# Patient Record
Sex: Female | Born: 1975 | Hispanic: No | Marital: Married | Smoking: Never smoker
Health system: Southern US, Community
[De-identification: ages and names within clinical notes are randomized; demographics above are authoritative.]

## PROBLEM LIST (undated history)

## (undated) DIAGNOSIS — O24419 Gestational diabetes mellitus in pregnancy, unspecified control: Secondary | ICD-10-CM

## (undated) DIAGNOSIS — E119 Type 2 diabetes mellitus without complications: Secondary | ICD-10-CM

## (undated) HISTORY — PX: NO PAST SURGERIES: SHX2092

---
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
## 2018-12-22 NOTE — L&D Delivery Note (Signed)
LABOR COURSE Patient was admitted 09/01/19 for IOL due to Polyhydramnios. Patient also had non-reactive NST and BPP 4/8 in clinic 09/01/19. She was started on Pitocin and augmented with AROM.   She developed Gestational Hypertension during the intrapartum period. Her blood pressures were elevated but below severe range and she did not develop severe symptoms. PEC labs WNL.  Delivery Note Called to room and patient was complete and pushing. Head delivered OA. No nuchal cord present. Compound hand presentation noted. Shoulder and body delivered in usual fashion. At 0516 a viable female was delivered via Vaginal, Spontaneous (Presentation: OA, no restitution).  Infant with spontaneous cry, placed on mother's abdomen, dried and stimulated. Cord clamped x 2 after one-minute delay, and cut by FOB. Cord blood drawn. Placenta delivered spontaneously with gentle cord traction. Appears intact. Fundus firm with massage and Pitocin. 800 mcg Cytotec placed rectally for brisk bleeding. Labia, perineum, vagina, and cervix inspected.   APGAR:8 ,9 ; weight : 4020g .   Cord: 3VC with the following complications:none.   Cord pH: Not collected  Anesthesia:  No epidural, local Lidocaine used for perineal repair Episiotomy: None Lacerations: 2nd degree perineal Suture Repair: 2.0 vicryl Est. Blood Loss (mL): 300  Mom to postpartum.  Baby to Couplet care / Skin to Skin.  Mallie Snooks, CNM 09/02/19 7:27 AM

## 2019-05-26 ENCOUNTER — Ambulatory Visit (INDEPENDENT_AMBULATORY_CARE_PROVIDER_SITE_OTHER): Payer: Self-pay | Admitting: *Deleted

## 2019-05-26 ENCOUNTER — Other Ambulatory Visit: Payer: Self-pay

## 2019-05-26 DIAGNOSIS — Z789 Other specified health status: Secondary | ICD-10-CM | POA: Insufficient documentation

## 2019-05-26 DIAGNOSIS — O0990 Supervision of high risk pregnancy, unspecified, unspecified trimester: Secondary | ICD-10-CM | POA: Insufficient documentation

## 2019-05-26 DIAGNOSIS — O09529 Supervision of elderly multigravida, unspecified trimester: Secondary | ICD-10-CM | POA: Insufficient documentation

## 2019-05-26 DIAGNOSIS — O0992 Supervision of high risk pregnancy, unspecified, second trimester: Secondary | ICD-10-CM

## 2019-05-26 NOTE — Progress Notes (Signed)
I connected with  Brooke Reid on 05/26/19 at 10:15 AM EDT by telephone and verified that I am speaking with the correct person using two identifiers.   I discussed the limitations, risks, security and privacy concerns of performing an evaluation and management service by telephone and virtually and the availability of in person appointments. I also discussed with the patient that there may be a patient responsible charge related to this service. The patient expressed understanding and agreed to proceed. She has not started prenatal care- was sent to [place] by [facility]. She is certain of her LMP and has regular periods.  Assisted with attempting to downloading and starting webex app. - after multiple attempts informed her we will help her in office at her new ob appt.  She states she has a bp cuff at home. Eplained we will have her take her blood pressure weekly and log it - then give to [place] at each visit . Also explained if 140/90 or higher call our office.  Also gave her appointment for new ob appointment and to wear mask to each appointment in our office - and if having any covid 19 symptoms do not come to office- call us. Explained she will be given her  [place] appointment at her next visit that I will schedule today. She voices understanding.   Linda,RN 05/26/2019  10:27 AM

## 2019-05-30 ENCOUNTER — Other Ambulatory Visit: Payer: Self-pay

## 2019-05-30 ENCOUNTER — Ambulatory Visit (INDEPENDENT_AMBULATORY_CARE_PROVIDER_SITE_OTHER): Payer: Self-pay | Admitting: Obstetrics and Gynecology

## 2019-05-30 ENCOUNTER — Encounter: Payer: Self-pay | Admitting: Obstetrics and Gynecology

## 2019-05-30 VITALS — BP 129/88 | HR 88 | Temp 98.2°F | Wt 218.6 lb

## 2019-05-30 DIAGNOSIS — O09522 Supervision of elderly multigravida, second trimester: Secondary | ICD-10-CM

## 2019-05-30 DIAGNOSIS — O0992 Supervision of high risk pregnancy, unspecified, second trimester: Secondary | ICD-10-CM

## 2019-05-30 DIAGNOSIS — Z3A25 25 weeks gestation of pregnancy: Secondary | ICD-10-CM

## 2019-05-30 DIAGNOSIS — Z124 Encounter for screening for malignant neoplasm of cervix: Secondary | ICD-10-CM

## 2019-05-30 DIAGNOSIS — Z789 Other specified health status: Secondary | ICD-10-CM

## 2019-05-30 DIAGNOSIS — Z113 Encounter for screening for infections with a predominantly sexual mode of transmission: Secondary | ICD-10-CM

## 2019-05-30 DIAGNOSIS — Z1151 Encounter for screening for human papillomavirus (HPV): Secondary | ICD-10-CM

## 2019-05-30 DIAGNOSIS — O09529 Supervision of elderly multigravida, unspecified trimester: Secondary | ICD-10-CM

## 2019-05-30 DIAGNOSIS — O0990 Supervision of high risk pregnancy, unspecified, unspecified trimester: Secondary | ICD-10-CM

## 2019-05-30 NOTE — Addendum Note (Signed)
Addended by: Bethanne Ginger on: 05/30/2019 09:41 AM   Modules accepted: Orders

## 2019-05-30 NOTE — Patient Instructions (Addendum)
AREA PEDIATRIC/FAMILY PRACTICE PHYSICIANS  [place] ([address]) . [facility] o Chambliss, MD; Eniola, MD; Hale, MD; Hensel, MD; McDiarmid, MD; McIntyer, MD; Neesa Knapik, MD; Walden, MD o [address] [place], [place] [address] o [phone] o Mon-Fri 8:30-12:30, 1:30-5:00 o Providers come to see babies at [facility] o Accepting Medicaid . [facility] o Limited providers who accept newborns: Koirala, MD; Morrow, MD; Wolters, MD o [address], [place], [place] [address] o [phone] o Mon-Fri 8:00-5:30 o Babies seen by providers at [facility] o Does NOT accept Medicaid o Please call early in hospitalization for appointment (limited availability)  . [facility] o Mulberry, MD o [address] [place], [place] [address] o [phone] o Mon, Tue, Thur, Fri 8:30-5:00, Wed 10:00-7:00 (closed 1-2pm) o Babies seen by [facility] providers o Accepting Medicaid . [facility] Pediatric[facility] o Rubin, MD o [address], [place], [place] [address] o [phone] o Mon-Fri 8:30-5:00, Sat 8:30-12:00 o Provider comes to see babies at [facility] o Accepting Medicaid o Must have been referred from current patients or contacted office prior to delivery . [facility] ([facility] o Brown, MD; Chandler, MD; Ettefagh, MD; Grant, MD; Lester, MD; McCormick, MD; McQueen, MD; Prose, MD; Simha, MD; Stanley, MD; Stryffeler, NP; Tebben, NP o [address], [place], [place] [address] o [phone] o Mon, Tue, Thur, Fri 8:30-5:30, Wed 9:30-5:30, Sat 8:30-12:30 o Babies seen by [facility] providers o Accepting Medicaid o Only accepting infants of first-time parents or siblings of current patients o Hospital discharge coordinator will make follow-up appointment . Jack Amos o [address],  [place], [place]  [address] o [phone]   Fax - [phone] . [facility] o [address], [place], [place]  [address] o Phone - [phone]   Fax - [phone] . Shilpa Gosrani o [address], [place], [place]  [address] o [phone]  [place] ([address]) . [facility] o Bates, MD; [place], MD; Cooper, Cox, MD; MD; Davis, MD; Dovico, MD; Ettefaugh, MD; Little, MD; Lowe, MD; Keiffer, MD; Melvin, MD; Sumner, MD; Williams, MD o [address], [place], [place] [address] o [phone] o Mon-Fri 8:30-5:00 (extended evenings Mon-Thur as needed), Sat-Sun 10:00-1:00 o Providers come to see babies at [facility] o Accepting Medicaid for families of first-time babies and families with all children in the household age 3 and under. Must register with office prior to making appointment (M-F only). . [facility] o Henson, NP; Knapp, MD; Lalonde, MD; Tysinger, PA o [address] [place], [place] [address] o [phone] o Mon-Fri 8:00-5:00 o Babies seen by providers at [facility] o Does NOT accept Medicaid/Commercial Insurance Only . [facility] ([facility]  o Artis, MD; Barnes, MD; Bratton, MD; Coccaro, MD; [place] Gardner, MD; Kramer, MD; Marshall, MD; Netherton, MD; Poleto, MD; Skinner, MD o [address] [place], [place] [address] o [phone] o Mon-Fri 8:30-5:30, Sat (Oct.-Mar.) 9:00-1:00 o Babies seen by providers at [facility] o Accepting Medicaid  [place] ([address]) . [facility] o Reid, MD; Warner, MD o [address], [place],  [address] o [phone] o Mon-Fri 8:30-5:00, Sat 8:30-12:00 o Providers come to see babies at [facility] o Does NOT accept Medicaid . [facility] o Becker, PA; Hagler, MD; Scifres, PA; Sun, MD; Swayne, MD o [address],  [place], [place] [address] o [phone] o Mon-Fri 8:00-5:00 o Babies seen by providers at [facility] o Does NOT accept Medicaid o Only accepting babies of parents who are patients o Please call early in hospitalization for appointment (limited availability) . [facility] o Clark, MD; Frye, MD; Kelleher, MD; Mack, NP; Miller, MD; O'Keller, MD; Patterson, NP; Pudlo, MD; Puzio, MD; Thomas, MD; Tucker, MD; Twiselton, MD o [address], [place], [place] [address] o [phone] o Mon-Fri 8:00-5:00, Sat 9:00-12:00 o Providers come to see babies at [facility] o Does NOT accept Medicaid  [place] ([address]) . [facility] o Limited providers accepting new patients: Brake, NP; Wharton, PA o [address], [place], [place] [address] o [phone] o Mon-Fri 8:00-5:00 o Babies seen by providers at [facility] o Does NOT accept Medicaid o Only accepting babies of parents who are patients o Please call early in hospitalization for appointment (limited availability) . [facility] o Gay, MD; Quinlan, MD o [address] [place], [place] [address] o [phone] (press 1 to schedule appointment) o Mon-Fri 8:00-5:00 o Providers come to see babies at [facility] o Does NOT accept Medicaid . [facility] o Mazer, MD o [address] [place], [place] [address] o [phone] o Mon-Fri 8:30-5:00 (lunch 12:30-1:00), extended hours by appointment only Wed 5:00-6:30 o Babies seen by [facility] providers o Accepting Medicaid . [facility] o Banks, MD; Jordan, MD; Koberlein, MD o [address], [place], [place] [address] o [phone] o Mon-Fri 8:00-5:00 o Babies seen by [facility] providers o Does NOT accept Medicaid . [facility] o Parker, MD; Hunter, MD; Wallace, DO o [address] [place], [place]  [address] o [phone] o Mon-Fri 8:00-5:00 o Babies seen by [facility] providers o Does NOT accept Medicaid . [facility] o Brandon, PA; Brecken, PA; Christy, NP; Dees, MD; DeClaire, MD; DeWeese, MD; Hansen, NP; Mills, NP; Parrish, NP; Smoot, NP; Summer, MD; Vapne, MD o [address] [place], [place] [address] o [phone] o Mon-Fri 8:30-5:00, Sat 10:00-1:00 o Providers come to see babies at [facility] o Does NOT accept Medicaid o Free prenatal information session Tuesdays at 4:45pm . [facility] o Bouska, MD; Gordon, PA; Jeffery, PA; Weber, PA o [address] [place] [address] o [phone] o Mon-Fri 7:30-5:30 o Babies seen by [facility] providers . [facility] o [address], [place], [place], [place]  [address] o [phone]   Fax - [phone]  [place] ([phone] & [phone]) . [facility] o [place]ese, [place] o [address] [place], [place] [address] o [phone] o Mon-Thur 8:00-6:00 o Providers come to see babies at [facility] o Accepting Medicaid . [facility] o Anderson, NP; Badger, MD; Beal, PA; Spencer, PA o [address] [place],  [address] o [phone] o Mon-Thur 7:30-7:30, Fri 7:30-4:30 o Babies seen by [facility] providers o Accepting Medicaid . [facility] o Agbuya, MD; Klett, NP; Romgoolam, MD o [address], ,  [address] o [phone] o Mon-Fri 8:30-5:00, Sat 8:30-12:00 o Providers come to see babies at [facility] o Accepting Medicaid o Must have "Meet & Greet" appointment at office prior to delivery . [facility]  ([facility] ) o McCord,   MD; Juleen China, MD; Clydene Laming, [place] [address], [place], [place] [address] o [phone] o Mon-Wed 8:00-6:00, Thur-Fri 8:00-5:00, Sat 9:00-12:00 o Providers come to  see babies at [facility] o Does NOT accept Medicaid o Only accepting siblings of current patients . [facility] [place], [place], [place], [place]  [address] o [phone]   Fax [phone] . [facility] [address], [place], [place]  [address] o [phone]   Fax - Morton Gorman [phone] & [phone]) . Therapist, music at [place], DO; [place], [address] [place], [place] [address] o [phone] o Mon-Fri 7:00-5:00 o Babies seen by [facility] providers o Does NOT accept Medicaid . [place], MD; [place], [place]; [place], [place], [place], [place] [address] o [phone] o Mon-Fri 8:00-5:00 o Babies seen by [facility] providers o Accepting Medicaid . Lamont, MD; [place], [place]; [place], NP; [place], [place], [place], [place] [address] o [phone] o Mon-Fri 8:00-5:00 o Babies seen by providers at [facility] [place] [phone]) . [facility] [place], [place] o [address] [place], [place] [address] o [phone] o Mon-Fri 8:00-5:00 o Babies seen by [facility] providers o Does NOT accept Medicaid o Limited availability, please call early in hospitalization to schedule follow-up . [facility] Leilani Merl, PA; Maisie Fus, MD; [place], MD; [place], [place]; Jeannine Kitten, MD; [place], [place] [facility] [address], [place], [place] [address] o [phone] o Mon-Fri 8:30-5:00, Sat 9:00-12:00 o Babies seen by providers at [facility] o Accepting Medicaid o Please register online then schedule online or call office o [email or web address] . [place] [facility]  [place]) Kristian C[place]y, NP; Dwyane Dee, MD; Leonidas Romberg, PA o [address] [place], [place], [place] [address] o [phone] o Mon-Fri 8:00-5:00 o Babies seen by providers at [facility] o Accepting Medicaid . [place] ([facility]) Dairl Ponder, MD; Rayvon Char, NP; Melina Modena, MD o [address] [place], [place], [place] [address] o [phone] o Mon-Fri 8:00-5:30, Sat&Sun by appointment (phones open at 8:30) o Babies seen by [facility] providers o Accepting Medicaid o Must be a first-time baby or sibling of current patient . [place], [address], [place], [place]  [address] o [phone]   Fax - [phone]  [place] [phone] & [phone]) . [place], [place]; [place], [place]; Benjamine Mola, MD; [place], [place]; Harrell Lark, MD o [address] [place], [place] [address] o [phone] o Mon-Thur 8:00-7:00, Fri 8:00-5:00, Sat 8:00-12:00, Sun 9:00-12:00 o Babies seen by [facility] providers o Accepting Medicaid . [facility], MD; Ruthann Cancer, MD; [facility], MD o [address], [place], [place] [address] o [phone] o Mon-Thur 8:00-5:00 o Babies seen by providers at [facility] o Accepting Medicaid . [facility] [place], MD; Coe-Goins, MD; Amedeo Plenty, MD; Bobby Rumpf, MD; List, MD; Lavonia Drafts, MD; Ruthann Cancer, MD; Selinda Eon, MD; Audie Box, MD; Jim Like, MD; Christie Nottingham, MD; Hubbard Hartshorn, MD; Modena Nunnery, MD o [address] [place], [place]  [address] o [phone] o Mon-Fri 8:00-5:30, Sat (Oct.-Mar.) 9:00-1:00 o Babies seen by providers at [facility] o Accepting Medicaid o Must fill out new patient packet, available online at [email or web address] . [facility] ([facility] o Friddle, NP; Harris, NP; Kelly, NP; Logan, MD;  Melvin, PA; Poth, MD; Ramadoss, MD; Stanton, NP o [address], [place], [place] [address] o [phone] o Mon-Thur 8:00-5:30, Fri 8:00-5:00 o Babies seen by providers at [facility] o Accepting Medicaid  [place] ([address]) . [facility] o Dixon, PA; Dawson, MD; Pickard, MD; Tapia, PA o [address], [place], [place] [address] o [phone] o Mon-Fri 8:00-5:00 o Babies seen by providers at [facility] o Accepting Medicaid   [place] ([address]) . [facility] [place] o Masneri, DO; Meyers, MD; Nelson, PA o [address], [place], [place] [address] o [phone] o Mon-Fri 8:00-5:00 o Babies seen by providers at [facility] o Does NOT accept Medicaid o Limited appointment availability, please call early in hospitalization  . [facility] [place] o Kunedd, DO; McGowen, MD o [address], [place], [place] [address] o [phone] o Mon-Fri 8:00-5:00 o Babies seen by [facility] providers o Does NOT accept Medicaid . [facility] [place] o Cameron, MD; MacDonald, MD; Michaels, PA; Nayak, MD o [address] [place] [address], [place], [place] [address] o [phone] o Mon-Fri 8:00-5:00 o After hours clinic ([address] [place], [place] [address]) [phone] Mon-Fri 5:00-8:00, Sat 12:00-6:00, Sun 10:00-4:00 o Babies seen by [facility] providers o Accepting Medicaid . [facility] [place] o [address], [place], [place]  [address] o [phone]   Fax - [phone]  [place] ([address]) . [facility] [place] [facility] o Andy, MD o [address], [place], [place] [address] o [phone] o Mon-Fri 8:00-5:00 o Babies seen by [facility] providers o Does NOT accept Medicaid . [facility] [place] ([facility] [place]) o Eksir, MD o [address], [place], [place]  [address] o [phone] o Mon-Thur 8:00-7:00, Fri 8:00-5:00, Sat 8:00-12:00 o Babies seen by providers at [facility] o Accepting Medicaid - but does not have vaccinations in office (must be received elsewhere) o Limited availability, please call early in hospitalization  [place] ([address]) . [facility]  o Charlene Flemming, MD o [address], [place] [address] o [phone]  Fax [phone] Places to have your son circumcised:                                                                      [facility]     [phone]   $480 while you are in hospital         [facility]              [phone]   $269 by 4 wks                      [facility]                     [phone]   $  269 by 7 days [facility]                    [phone]   $269 by 4 wks [facility]             [phone]   $175 by 2 wks    These prices sometimes change but are roughly what you can expect to pay. Please call and confirm pricing.   Circumcision is considered an elective/non-medically necessary procedure. There are many reasons parents decide to have their sons circumsized. During the first year of life circumcised males have a reduced risk of urinary tract infections but after this year the rates between circumcised males and uncircumcised males are the same.  It is safe to have your son circumcised outside of the hospital and the places above perform them regularly.   Deciding about Circumcision in Baby Boys  (Up-to-date The Basics)  What is circumcision?   Circumcision is a surgery that removes the skin that covers the tip of the penis, called the "foreskin" Circumcision is usually done when a boy is between 54 and 102 days old. In the [place], circumcision is common. In some other countries, fewer boys are circumcised. Circumcision is a common tradition in some religions.  Should I have my baby boy circumcised?   There is no easy answer. Circumcision has some benefits. But it also has risks.  After talking with your doctor, you will have to decide for yourself what is right for your family.  What are the benefits of circumcision?   Circumcised boys seem to have slightly lower rates of: ?Urinary tract infections ?Swelling of the opening at the tip of the penis Circumcised men seem to have slightly lower rates of: ?Urinary tract infections ?Swelling of the opening at the tip of the penis ?Penis cancer ?HIV and other infections that you catch during sex ?Cervical cancer in the women they have sex with Even so, in the [place], the risks of these problems are small - even in boys and men who have not been circumcised. Plus, boys and men who are not circumcised can reduce these extra risks by: ?Cleaning their penis well ?Using condoms during sex  What are the risks of circumcision?  Risks include: ?Bleeding or infection from the surgery ?Damage to or amputation of the penis ?A chance that the doctor will cut off too much or not enough of the foreskin ?A chance that sex won't feel as good later in life Only about 1 out of every 200 circumcisions leads to problems. There is also a chance that your health insurance won't pay for circumcision.  How is circumcision done in baby boys?  First, the baby gets medicine for pain relief. This might be a cream on the skin or a shot into the base of the penis. Next, the doctor cleans the baby's penis well. Then he or she uses special tools to cut off the foreskin. Finally, the doctor wraps a bandage (called gauze) around the baby's penis. If you have your baby circumcised, his doctor or nurse will give you instructions on how to care for him after the surgery. It is important that you follow those instructions carefully.  Ba thng th? hai c?a [place] k? Second Trimester of Pregnancy Ba thng th? hai l t? tu?n 14 ??n tu?n 27 (thng th? 4 ??n th? thng th? 6). Ba thng th? hai th??ng l th?i gian m qu v? c?m th?y tho?i mi nh?t. C? th?  c?a qu v? c?ng ? ?i?u ch?nh ?? ph h?p v?i vi?c mang thai v qu v? b?t ??u c?m th?y t?t h?n v? m?t th? ch?t. Thng th??ng, tnh tr?ng nghn ? t h?n ho?c h?t hon ton, qu v? c th? c nhi?u n?ng l??ng h?n v qu v? c th? ?n ngon mi?ng h?n. Ba thng th? hai c?ng l th?i gian m bo [place] ?ang pht tri?n nhanh chng. Vo cu?i thng th? su, bo thai di kho?ng 9 in s? v n?ng kho?ng 1 pao. Qu v? c kh? n?ng s? b?t ??u c?m th?y em b c? ??ng (thai ??p l?n ??u) trong kho?n tu?n 16 ??n 20 c?a [place] k?. Cc thay ??i c? th? trong ba thng th? hai c?a [place] k? C? th? qu v? b?t ??u tr?i qua nhi?u thay ??i trong ba thng th? hai c?a [place] k?. Nh?ng thay ??i khc nhau ? cc ph? n? s? khc nhau. Cn n?ng c?a qu v? s? ti?p t?c t?ng. Qu v? s? nh?n th?y b?ng ph?ng to ra. Qu v? c th? b?t ??u c cc v?t r?n trn hng, b?ng v v. Qu v? c th? b? ?au ??u, c th? gi?m ?au b?ng thu?c. Cc lo?i thu?c ph?i ???c chuyn gia ch?m [place] s?c kh?e c?a qu v? ch?p thu?n. Qu v? c th? ?i ti?u th??ng xuyn h?n v bo thai p vo bng quang c?a qu v?. Qu v? c th? b? ho?c ti?p t?c b? ? nng do k?t qu? c?a vi?c mang [place]. Qu v? c th? b? to bn v m?t s? hc-mn nh?t ??nh ?ang lm cc c? ??y ch?t th?i qua ???ng ru?t ho?t ??ng ch?m l?i. Qu v? c th? b? tr? ho?c s?ng, phnh t?nh m?ch (gin t?nh m?ch). Quy? vi? c th? bi? ?au l?ng. V?n ?? ny l do: T?ng cn. Cc hc-mn [place] k? lm th? gin cc kh?p ? khung ch?u c?a qu v?. Thay ??i v? cn n?ng v cc c? h? tr? th?ng b?ng c?a qu v?. V c?a qu v? s? ti?p t?c pht tri?n v ti?p t?c tr? nn nh?y c?m ?au. N??u (l?i) c?a qu v? c th? ch?y mu v c th? nh?y c?m v?i vi?c ?nh r?ng v dng ch? Darcie khoa. Cc ??m ho?c v?t t?i mu (rm m, m?t n? [place] k?) c th? pht tri?n trn m?t qu v?. V?t ny c th? s? m? sau khi em b ???c sinh ra. M?t ???ng s?m mu ch?y t? r?n ??n vng lng mu (???ng linea nigra) c th? xu?t hi?n. V?t ny c th? s? m? sau khi em b ???c sinh ra. Qu  v? c nh?ng thay ??i v? tc. Nh?ng thay ??i ny c th? bao g?m tc dy ln, tc m?c nhanh h?n v thay ??i v? k?t c?u tc. M?t s? ph? n? c?ng b? r?ng tc trong khi ho?c sau khi mang thai, ho?c c?m th?y tc kh ho?c m?ng. Tc c?a qu v? s? c nhi?u kh? n?ng s? tr? l?i bnh th??ng sau khi em b ???c sinh ra. Nh?ng g d? ki?n s? x?y ra ? cc l?n khm tr??c khi sinh Trong m?t l?n khm tr??c khi sinh th??ng quy: Qu v? s? ???c cn ?? ??m b?o qu v? v [place] nhi ?ang pht tri?n bnh th??ng. Qu v? s? ???c ?o huy?t p. Qu v? s? ???c ?o vng b?ng ?? theo di s? pht tri?n c?a b. Nh?p tim thai s? ???c nghe. B?t k? k?t qu? ki?m tra no t?  l?n khm tr??c ? c?ng s? ???c th?o lu?n. Chuyn gia ch?m [place] s?c kh?e c th? h?i qu v?: Qu v? c?m th?y th? no. Qu v? c c?m th?y em b c? ??ng hay khng. Qu v? c b?t k? tri?u ch?ng b?t th??ng no, ch?ng h?n nh? ch?y d?ch, ch?y mu, ?au ??u r?t nhi?u ho?c co th?t ? b?ng hay khng. Qu v? c ?ang s? d?ng b?t k? s?n ph?m thu?c l no, bao g?m thu?c l d?ng ht, thu?c l d?ng nhai v thu?c l ?i?n t? hay khng. Qu v? c b?t k? cu h?i no hay khng. Nh?ng ki?m tra khc c th? ???c th?c hi?n trong ba thng th? hai c?a qu v? bao g?m: Xt nghi?m mu ?? ki?m tra: L??ng s?t th?p (thi?u mu). ???ng huy?t cao ?nh h??ng ??n ph? n? mang thai (ti?u ???ng [place] k?) t? tu?n 24 ??n tu?n 28. Khng th? RH. Xt nghi?m ny ?? ki?m tra m?t protein trn h?ng c?u (y?u t? Rh). Xt nghi?m n??c ti?u ?? ki?m tra nhi?m trng, ti?u ???ng ho?c protein trong n??c ti?u. Siu m ?? xc ??nh s? t?ng tr??ng v pht tri?n ph h?p c?a em b. Ch?c ?i ?? ki?m tra cc v?n ?? v? di truy?n c th? c. Ki?m tra bo [place] xem c b? n?t ??t s?ng ho?c h?i ch?ng Down hay khng. Xt nghi?m HIV (vi rt gy suy gi?m mi?n d?ch ? ng??i). Xt nghi?m tr??c khi sinh th??ng quy bao g?m sng l?c HIV, tr? khi qu v? ch?n khng lm xt nghi?m ny. Tun th? nh?ng h??ng d?n ny ? nh: Thu?c Tun th? cc ch? d?n c?a  chuyn gia ch?m [place] s?c kh?e v? vi?c s? d?ng thu?c. M?t s? lo?i thu?c c? th? co? th? an ton ho?c khng an ton [place] dng trong qu trnh [place] thai. U?ng vitamin tr??c khi sinh c t nh?t 600 microgram (mcg) axit folic. N?u quy? vi? bi? ta?o bo?n, hy th? dng thu?c lm m?m phn n?u chuyn gia ch?m [place] s?c kh?e c?a qu v? ch?p thu?n. ?n v u?ng  ?n ch? ?? ?n cn b?ng bao g?m tri cy t??i v rau c?, ng? c?c nguyn cm, cc ngu?n protein t?t nh? th?t, tr?ng, ??u h? v s?a t bo. Chuyn gia ch?m [place] s?c kh?e c?a quy? vi? s? gip quy? vi? xc ??nh m??c t?ng cn phu? h??p cho quy? vi?. [place] th?t s?ng v pho mt ch?a n?u chn. Nh?ng th? ny mang m?m b?nh c th? gy d? t?t b?m sinh cho em b. N?u qu v? s? d?ng l??ng canxi th?p t? th?c ph?m, hy trao ??i v?i chuyn gia ch?m [place] s?c kh?e v? vi?c qu v? c c?n dng th?c ph?m b? sung canxi hng ngy hay khng. H?n ch? cc lo?i th?c ?n giu ch?t bo v ???ng tinh luy?n, ch?ng h?n nh? ?? ?n chin rn v ?? ng?t. ?? ng?n ng?a to bn: U?ng ?? n??c ?? gi? cho n??c ti?u trong ho?c c mu vng nh?t. ?n th?c ?n giu ch?t x? nh? tri cy t??i v rau, ng? c?c nguyn h?t v cc lo?i ??u. Ho?t ??ng Ch? t?p th? d?c theo ch? d?n c?a chuyn gia ch?m [place] s?c kh?e. H?u h?t ph? n? c th? ti?p t?c ch? ?? t?p luy?n bnh th??ng c?a h? trong khi mang [place]. C? g??ng t?p th? du?c 30 phu?t m?i nga?y, t nh?t 5 nga?y m?i tu?n. Ng?ng t?p th? d?c n?u qu v? c cc c?n co t? cung.  Trnh nng v?t n?ng, hy ?i giy th?p gt v luy?n t?p t? th? ph h?p. C th? ???c ti?p t?c c quan h? tnh d?c tr? khi chuyn gia ch?m [place] s?c kh?e yu c?u khc. Gi?m ?au v gi?m c?m gic kh ch?u M?c m?t chi?c o ng?c nng ?? v hi?u qu? ?? ng?n ng?a c?m gic kh ch?u do v b? nh?y c?m ?au. T?m b?n ng?i n??c ?m ?? lm d?u ?au ho?c d?u c?m gic kh ch?u do b?nh tr? gy ra. S? d?ng kem ?i?u tr? tr? n?u chuyn gia ch?m [place] s?c kh?e ch?p thu?n. Ngh? ng?i trong khi nng cao chn c?a qu v? n?u qu v?  b? chu?t rt chn ho?c ?au vng th?t l?ng. N?u qu v? b? gin t?nh m?ch, hy ?eo t?t h? tr?. Nng cao chn c?a qu v? trong 15 pht, 3-4 l?n m?t ngy. H?n ch? mu?i trong ch? ?? ?n c?a qu v?. Ch?m [place] tr???c khi sinh Vi?t ra cu h?i c?a quy? vi?. ?em ca?c cu ho?i ??n ca?c bu?i kha?m tr???c khi sinh. Tun th? t?t c? cc cu?c h?n khm tr??c khi sinh theo ch? d?n c?a chuyn gia ch?m [place] s?c kh?e. ?i?u ny c vai tr quan tr?ng. An ton Lun ?eo dy th?t an ton c?a qu v? khi li xe. L?p m?t danh sch cc s? ?i?n tho?i c?p c?u, bao g?m s? ?i?n thoa?i gia ?nh, b?n b, b?nh vi?n v c?nh st v s? c?u h?a. H??ng d?n chung H?i chuyn gia ch?m [place] s?c kh?e c?a quy? vi? ?? ???c gi?i thi?u ??n m?t l?p h?c tr???c khi sinh t?i ??a ph??ng. B?t ??u ca?c l?p h?c khng mu?n h?n vo ??u thng th?? 6 c?a [place]?. Yu c?u gip ?? n?u quy? vi? c nhu c?u t? v?n ho?c nhu c?u dinh d??ng trong th??i gian mang thai. Chuyn gia ch?m [place] s?c kh?e c?a quy? vi? c th? co? l?i khuyn ho?c gi?i thi?u quy? vi? ??n cc chuyn gia ?? ???c gip ?? v? ca?c nhu c?u khc nhau. Khng s? d?ng b?n t?m n??c nng, phng xng h?i, ho?c nh t?m h?i. Khng thu?t r??a ho?c s? d?ng nu?t b?ng v? sinh ho?c b?ng v? sinh c mi th?m. Khng b??t cho chn trong th?i gian di. Trnh h?p v? sinh c?a mo v ??t v? sinh dnh cho mo. Nh?ng th?? na?y mang vi trng c th? gy ra d? t?t b?m sinh ? tr? v c th? m?t [place] nhi do s?y [place] ho?c thai ch?t l?u. Trnh t?t c? khi, th?o d??c, r??u v ma ty khng ???c k ??n. Cc ch?t ha h?c trong nh??ng s?n ph?m ny c th? ?nh h??ng ??n s? hnh thnh v pht tri?n c?a em b. Khng s? d?ng b?t k? s?n ph?m no ch?a nicotine ho?c thu?c l, ch?ng ha?n nh? thu?c l d?ng ht v thu?c l ?i?n t?. N?u qu v? c?n gip ?? ?? cai thu?c, hy h?i chuyn gia ch?m [place] s?c kh?e. ??n khm [place] s? n?u qu v? ch?a khm trong th?i gian qu v? mang [place]. S? d?ng m?t bn ch?i m?m ?? ?nh r?ng c?a qu v? v hy nh? nhng khi  dng ch? Keylani khoa. Hy lin l?c v?i chuyn gia ch?m [place] s?c kh?e n?u: Qu v? b? chng m?t. Qu v? b? co th?t nh? ? vng ch?u, t?c n?ng ? vng ch?u, ?au m ? ? vng b?ng. Qu v? b? bu?n nn, nn m?a ho?c tiu ch?y lin t?c. Qu v?  c d?ch m ??o mi kh ch?u. Qu v? b? ?au khi ?i ti?u. Yu c?u tr? gip ngay l?p t?c n?u: Qu v? b? s?t. Qu v? b? r r? d?ch ? m ??o. Qu v? c ra ??m mu ho?c ch?y mu ? m ??o. Qu v? b? co th?t ho?c ?au r?t nhi?u ? b?ng. Qu v? t?ng cn ho?c s?t cn nhanh. Qu v? b? kh th? cng v?i ?au ng?c. Qu v? th?y s?ng ??t ng?t ho?c r?t nhi?u ? m?t, bn tay, m?t c chn, bn chn ho?c chn. Qu v? khng c?m th?y em b c? ??ng trong h?n m?t gi?Ladell Heads. Qu v? b? ?au ??u r?t nhi?u m khng h?t sau khi dng thu?c. Qu v? b? thay ??i th? l?c. Tm t?t Ba thng th? hai l t? tu?n 14 ??n tu?n 27 (thng th? 4 ??n th? thng th? 6). ? c?ng l th?i gian m bo [place] ?ang pht tri?n nhanh chng. C? th? c?a qu v? tr?i qua r?t nhi?u thay ??i trong th?i gian mang [place]. Nh?ng thay ??i khc nhau ? cc ph? n? s? khc nhau. Trnh t?t c? khi, th?o d??c, r??u v ma ty khng ???c k ??n. Cc ch?t ha h?c ny ?nh h??ng ??n s? hnh thnh v pht tri?n c?a em b. Khng s? d?ng b?t k? s?n ph?m thu?c l no, ch?ng ha?n thu?c l d?ng ht, thu?c l d?ng nhai v thu?c l ?i?n t?. N?u qu v? c?n gip ?? ?? cai thu?c, hy h?i chuyn gia ch?m [place] s?c kh?e. Hy lin l?c v?i chuyn gia ch?m [place] s?c kh?e n?u qu v? c b?t k? th?c m?c no. Tun th? t?t c? cc cu?c h?n khm tr??c khi sinh theo ch? d?n c?a chuyn gia ch?m [place] s?c kh?e. ?i?u ny c vai tr quan tr?ng. Thng tin ny khng nh?m m?c ?ch thay th? cho l?i khuyn m chuyn gia ch?m [place] s?c kh?e ni v?i qu v?. Hy b?o ??m qu v? ph?i th?o lu?n b?t k? v?n ?? g m qu v? c v?i chuyn gia ch?m [place] s?c kh?e c?a qu v?. Document Released: 01/04/2016 Document Revised: 04/20/2017 Document Reviewed: 04/20/2017 Elsevier Interactive Patient Education  2019  [employer]

## 2019-05-30 NOTE — Progress Notes (Signed)
Subjective:  Brooke Reid is a 43 y.o. G2P1001 at 4w1dbeing seen today for her first OB visit. EDD by certain LMP. No chronic medical problems or medications. H/O TSVD 4 yrs ago without problems.   She is currently monitored for the following issues for this high-risk pregnancy and has Supervision of high risk pregnancy, antepartum; AMA (advanced maternal age) multigravida 35+, unspecified trimester; and Language barrier on their problem list.  Patient reports no complaints.  Contractions: Not present. Vag. Bleeding: None.  Movement: Present. Denies leaking of fluid.   The following portions of the patient's history were reviewed and updated as appropriate: allergies, current medications, past family history, past medical history, past social history, past surgical history and problem list. Problem list updated.  Objective:   Vitals:   05/30/19 0837  BP: 129/88  Pulse: 88  Temp: 98.2 F (36.8 C)  Weight: 218 lb 9.6 oz (99.2 kg)    Fetal Status: Fetal Heart Rate (bpm): 150   Movement: Present     General:  Alert, oriented and cooperative. Patient is in no acute distress.  Skin: Skin is warm and dry. No rash noted.   Cardiovascular: Normal heart rate noted  Respiratory: Normal respiratory effort, no problems with respiration noted  Abdomen: Soft, gravid, appropriate for gestational age. Pain/Pressure: Absent     Pelvic:  Cervical exam performed        Extremities: Normal range of motion.  Edema: None  Mental Status: Normal mood and affect. Normal behavior. Normal judgment and thought content.   Urinalysis:      Assessment and Plan:  Pregnancy: G2P1001 at 282w1d1. Supervision of high risk pregnancy, antepartum Prenatal care and labs reviewed with pt Genetic testing reviewed - Culture, OB Urine - Inheritest(R) CF/SMA Panel - Genetic Screening - Hemoglobinopathy Evaluation - Obstetric Panel, Including HIV - Cytology - PAP( [place]) - US[place]FM OB DETAIL +14 WK; Future - TSH -  Comp Met (CMET) - HgB A1c  2. AMA (advanced maternal age) multigravida 356+unspecified trimester Genetic testing reviewed - Culture, OB Urine - Inheritest(R) CF/SMA Panel - Genetic Screening - Hemoglobinopathy Evaluation - Obstetric Panel, Including HIV - Cytology - PAP( [place]) - US[place]oreaFM OB DETAIL +14 WK; Future - TSH - Comp Met (CMET) - HgB A1c  3. Language barrier Live interruptered  Preterm labor symptoms and general obstetric precautions including but not limited to vaginal bleeding, contractions, leaking of fluid and fetal movement were reviewed in detail with the patient. Please refer to After Visit Summary for other counseling recommendations.  Return in about 3 weeks (around 06/20/2019) for OB visit, face to face for Glucola.   ErChancy MilroyMD

## 2019-05-31 ENCOUNTER — Encounter: Payer: Self-pay | Admitting: *Deleted

## 2019-05-31 ENCOUNTER — Telehealth: Payer: Self-pay | Admitting: *Deleted

## 2019-05-31 LAB — CYTOLOGY - PAP
Chlamydia: NEGATIVE
Diagnosis: NEGATIVE
HPV: NOT DETECTED
Neisseria Gonorrhea: NEGATIVE

## 2019-05-31 NOTE — Telephone Encounter (Signed)
I called Hiliary with [employer] [phone] and notified her we cancelled the [place] for at [place] and scheduled her with  [facility] for 06/02/19 at 1:30. Explained is because she is [employer]Mom and cost is $ 150. She voices understanding.

## 2019-06-07 ENCOUNTER — Ambulatory Visit (HOSPITAL_COMMUNITY): Payer: Self-pay

## 2019-06-13 LAB — COMPREHENSIVE METABOLIC PANEL
ALT: 19 IU/L (ref 0–32)
AST: 27 IU/L (ref 0–40)
Albumin/Globulin Ratio: 1.5 (ref 1.2–2.2)
Albumin: 3.8 g/dL (ref 3.8–4.8)
Alkaline Phosphatase: 57 IU/L (ref 39–117)
BUN/Creatinine Ratio: 16 (ref 9–23)
BUN: 9 mg/dL (ref 6–24)
Bilirubin Total: 0.3 mg/dL (ref 0.0–1.2)
CO2: 21 mmol/L (ref 20–29)
Calcium: 9.4 mg/dL (ref 8.7–10.2)
Chloride: 97 mmol/L (ref 96–106)
Creatinine, Ser: 0.55 mg/dL — ABNORMAL LOW (ref 0.57–1.00)
GFR calc Af Amer: 135 mL/min/{1.73_m2} (ref >59)
GFR calc non Af Amer: 117 mL/min/{1.73_m2} (ref >59)
Globulin, Total: 2.6 g/dL (ref 1.5–4.5)
Glucose: 171 mg/dL — ABNORMAL HIGH (ref 65–99)
Potassium: 4.8 mmol/L (ref 3.5–5.2)
Sodium: 134 mmol/L (ref 134–144)
Total Protein: 6.4 g/dL (ref 6.0–8.5)

## 2019-06-13 LAB — OBSTETRIC PANEL, INCLUDING HIV
Antibody Screen: NEGATIVE
Basophils Absolute: 0.1 10*3/uL (ref 0.0–0.2)
Basos: 1 %
EOS (ABSOLUTE): 0.1 10*3/uL (ref 0.0–0.4)
Eos: 1 %
HIV Screen 4th Generation wRfx: NONREACTIVE
Hematocrit: 43.1 % (ref 34.0–46.6)
Hemoglobin: 14.2 g/dL (ref 11.1–15.9)
Hepatitis B Surface Ag: NEGATIVE
Immature Grans (Abs): 0.3 10*3/uL — ABNORMAL HIGH (ref 0.0–0.1)
Immature Granulocytes: 2 %
Lymphocytes Absolute: 2.9 10*3/uL (ref 0.7–3.1)
Lymphs: 25 %
MCH: 28.8 pg (ref 26.6–33.0)
MCHC: 32.9 g/dL (ref 31.5–35.7)
MCV: 87 fL (ref 79–97)
Monocytes Absolute: 0.6 10*3/uL (ref 0.1–0.9)
Monocytes: 6 %
Neutrophils Absolute: 7.7 10*3/uL — ABNORMAL HIGH (ref 1.4–7.0)
Neutrophils: 65 %
Platelets: 288 10*3/uL (ref 150–450)
RBC: 4.93 x10E6/uL (ref 3.77–5.28)
RDW: 12.8 % (ref 11.7–15.4)
RPR Ser Ql: NONREACTIVE
Rh Factor: POSITIVE
Rubella Antibodies, IGG: 29.2 index (ref >0.99)
WBC: 11.7 10*3/uL — ABNORMAL HIGH (ref 3.4–10.8)

## 2019-06-13 LAB — HEMOGLOBINOPATHY EVALUATION
Ferritin: 78 ng/mL (ref 15–150)
Hgb A2 Quant: 2.3 % (ref 1.8–3.2)
Hgb A: 97.7 % (ref 96.4–98.8)
Hgb C: 0 %
Hgb F Quant: 0 % (ref 0.0–2.0)
Hgb S: 0 %
Hgb Solubility: NEGATIVE
Hgb Variant: 0 %

## 2019-06-13 LAB — INHERITEST(R) CF/SMA PANEL

## 2019-06-13 LAB — HEMOGLOBIN A1C
Est. average glucose Bld gHb Est-mCnc: 174 mg/dL
Hgb A1c MFr Bld: 7.7 % — ABNORMAL HIGH (ref 4.8–5.6)

## 2019-06-13 LAB — TSH: TSH: 1.54 u[IU]/mL (ref 0.450–4.500)

## 2019-06-14 ENCOUNTER — Encounter: Payer: Self-pay | Admitting: *Deleted

## 2019-06-15 ENCOUNTER — Encounter: Payer: Self-pay | Admitting: *Deleted

## 2019-06-22 ENCOUNTER — Other Ambulatory Visit: Payer: Self-pay

## 2019-06-22 ENCOUNTER — Ambulatory Visit (INDEPENDENT_AMBULATORY_CARE_PROVIDER_SITE_OTHER): Payer: Self-pay | Admitting: Obstetrics and Gynecology

## 2019-06-22 ENCOUNTER — Other Ambulatory Visit: Payer: Self-pay | Admitting: *Deleted

## 2019-06-22 ENCOUNTER — Encounter: Payer: Self-pay | Admitting: Obstetrics and Gynecology

## 2019-06-22 VITALS — BP 116/82 | HR 98 | Temp 98.6°F | Wt 221.6 lb

## 2019-06-22 DIAGNOSIS — O0993 Supervision of high risk pregnancy, unspecified, third trimester: Secondary | ICD-10-CM

## 2019-06-22 DIAGNOSIS — O0990 Supervision of high risk pregnancy, unspecified, unspecified trimester: Secondary | ICD-10-CM

## 2019-06-22 DIAGNOSIS — Z23 Encounter for immunization: Secondary | ICD-10-CM

## 2019-06-22 DIAGNOSIS — O09529 Supervision of elderly multigravida, unspecified trimester: Secondary | ICD-10-CM

## 2019-06-22 DIAGNOSIS — Z789 Other specified health status: Secondary | ICD-10-CM

## 2019-06-22 DIAGNOSIS — O09523 Supervision of elderly multigravida, third trimester: Secondary | ICD-10-CM

## 2019-06-22 NOTE — Progress Notes (Signed)
   PRENATAL VISIT NOTE  Subjective:  Anaisha Mago is a 43 y.o. G2P1001 at [redacted]w[redacted]d being seen today for ongoing prenatal care.  She is currently monitored for the following issues for this high-risk pregnancy and has Supervision of high risk pregnancy, antepartum; AMA (advanced maternal age) multigravida 35+, unspecified trimester; and Language barrier on their problem list.  Patient reports no complaints.  Contractions: Not present. Vag. Bleeding: None.  Movement: Present. Denies leaking of fluid.   The following portions of the patient's history were reviewed and updated as appropriate: allergies, current medications, past family history, past medical history, past social history, past surgical history and problem list.   Objective:   Vitals:   06/22/19 0810  BP: 116/82  Pulse: 98  Temp: 98.6 F (37 C)  Weight: 221 lb 9.6 oz (100.5 kg)    Fetal Status: Fetal Heart Rate (bpm): 143   Movement: Present     General:  Alert, oriented and cooperative. Patient is in no acute distress.  Skin: Skin is warm and dry. No rash noted.   Cardiovascular: Normal heart rate noted  Respiratory: Normal respiratory effort, no problems with respiration noted  Abdomen: Soft, gravid, appropriate for gestational age.  Pain/Pressure: Absent     Pelvic: Cervical exam deferred        Extremities: Normal range of motion.  Edema: None  Mental Status: Normal mood and affect. Normal behavior. Normal judgment and thought content.   Assessment and Plan:  Pregnancy: G2P1001 at [redacted]w[redacted]d  1. Supervision of high risk pregnancy, antepartum - 2 hr GTT - CBC - HIV - RPR - Tdap vaccine greater than or equal to 7yo IM  2. AMA (advanced maternal age) multigravida 46+, unspecified trimester Testing to start 36 weeks Repeat growth [place] ordered at [place]  3. Language barrier [place] interpretor used  Preterm labor symptoms and general obstetric precautions including but not limited to vaginal bleeding, contractions,  leaking of fluid and fetal movement were reviewed in detail with the patient. Please refer to After Visit Summary for other counseling recommendations.   Return in about 2 weeks (around 07/06/2019) for OB visit (MD), virtual.  Future Appointments  Date Time Provider [place]  06/22/2019  8:50 AM [facility] LAB [facility] W[facility]    Sloan Leiter, MD

## 2019-06-23 LAB — GLUCOSE TOLERANCE, 2 HOURS W/ 1HR
Glucose, 1 hour: 322 mg/dL — ABNORMAL HIGH (ref 65–179)
Glucose, 2 hour: 234 mg/dL — ABNORMAL HIGH (ref 65–152)
Glucose, Fasting: 159 mg/dL — ABNORMAL HIGH (ref 65–91)

## 2019-06-23 LAB — CBC
Hematocrit: 40.6 % (ref 34.0–46.6)
Hemoglobin: 13.5 g/dL (ref 11.1–15.9)
MCH: 29.2 pg (ref 26.6–33.0)
MCHC: 33.3 g/dL (ref 31.5–35.7)
MCV: 88 fL (ref 79–97)
Platelets: 270 10*3/uL (ref 150–450)
RBC: 4.63 x10E6/uL (ref 3.77–5.28)
RDW: 13.1 % (ref 11.7–15.4)
WBC: 11.1 10*3/uL — ABNORMAL HIGH (ref 3.4–10.8)

## 2019-06-23 LAB — HIV ANTIBODY (ROUTINE TESTING W REFLEX): HIV Screen 4th Generation wRfx: NONREACTIVE

## 2019-06-23 LAB — RPR: RPR Ser Ql: NONREACTIVE

## 2019-06-27 ENCOUNTER — Encounter: Payer: Self-pay | Admitting: Obstetrics and Gynecology

## 2019-06-27 DIAGNOSIS — O24419 Gestational diabetes mellitus in pregnancy, unspecified control: Secondary | ICD-10-CM | POA: Insufficient documentation

## 2019-06-28 ENCOUNTER — Encounter: Payer: Self-pay | Admitting: *Deleted

## 2019-07-11 ENCOUNTER — Other Ambulatory Visit: Payer: Self-pay

## 2019-07-11 ENCOUNTER — Ambulatory Visit (INDEPENDENT_AMBULATORY_CARE_PROVIDER_SITE_OTHER): Payer: Self-pay | Admitting: Obstetrics and Gynecology

## 2019-07-11 VITALS — BP 120/80 | HR 99

## 2019-07-11 DIAGNOSIS — O09529 Supervision of elderly multigravida, unspecified trimester: Secondary | ICD-10-CM

## 2019-07-11 DIAGNOSIS — O0993 Supervision of high risk pregnancy, unspecified, third trimester: Secondary | ICD-10-CM

## 2019-07-11 DIAGNOSIS — O09523 Supervision of elderly multigravida, third trimester: Secondary | ICD-10-CM

## 2019-07-11 DIAGNOSIS — Z3A31 31 weeks gestation of pregnancy: Secondary | ICD-10-CM

## 2019-07-11 DIAGNOSIS — Z789 Other specified health status: Secondary | ICD-10-CM

## 2019-07-11 DIAGNOSIS — O0990 Supervision of high risk pregnancy, unspecified, unspecified trimester: Secondary | ICD-10-CM

## 2019-07-11 DIAGNOSIS — O24419 Gestational diabetes mellitus in pregnancy, unspecified control: Secondary | ICD-10-CM

## 2019-07-11 NOTE — Progress Notes (Signed)
   TELEHEALTH VIRTUAL OBSTETRICS VISIT ENCOUNTER NOTE  Clinic: [facility]  I connected with Leronda Lewers on 07/11/19 at 10:15 AM EDT by telephone at home and verified that I am speaking with the correct person using two identifiers.   I discussed the limitations, risks, security and privacy concerns of performing an evaluation and management service by telephone and the availability of in person appointments. I also discussed with the patient that there may be a patient responsible charge related to this service. The patient expressed understanding and agreed to proceed.  Subjective:  Brooke Reid is a 43 y.o. G2P1001 at [redacted]w[redacted]d being followed for ongoing prenatal care.  She is currently monitored for the following issues for this high-risk pregnancy and has Supervision of high risk pregnancy, antepartum; AMA (advanced maternal age) multigravida 41+, unspecified trimester, unspecified trimester; Language barrier; and Gestational diabetes on their problem list.  Patient reports no complaints. Reports fetal movement. Denies any contractions, bleeding or leaking of fluid.   The following portions of the patient's history were reviewed and updated as appropriate: allergies, current medications, past family history, past medical history, past social history, past surgical history and problem list.   Objective:   Vitals:   07/11/19 1024  BP: 120/80  Pulse: 99    Babyscripts Data Reviewed: not applicable  General:  Alert, oriented and cooperative.   Mental Status: Normal mood and affect perceived. Normal judgment and thought content.  Rest of physical exam deferred due to type of encounter  Assessment and Plan:  Pregnancy: G2P1001 at [redacted]w[redacted]d 1. Gestational diabetes mellitus (GDM), antepartum, gestational diabetes method of control unspecified Pt dx'ed on 7/6 but has not been set up to see dm education visit or supplies. [place] precautions given. Has rpt u/s with [facility] on 7/29 already set up (6/11:  efw 25%, normal ac and afi). Start ap testing next week until dm control is established and continue with serial growth u/s. Delivery at 39wks  2. Supervision of high risk pregnancy, antepartum Pt is adopt a mom  3. AMA (advanced maternal age) multigravida 41+, unspecified trimester, unspecified trimester See above  4. Language barrier Interpreter used  Preterm labor symptoms and general obstetric precautions including but not limited to vaginal bleeding, contractions, leaking of fluid and fetal movement were reviewed in detail with the patient.  I discussed the assessment and treatment plan with the patient. The patient was provided an opportunity to ask questions and all were answered. The patient agreed with the plan and demonstrated an understanding of the instructions. The patient was advised to call back or seek an in-person office evaluation/go to [facility] at [facility] for any urgent or concerning symptoms. Please refer to After Visit Summary for other counseling recommendations.   I provided 10 minutes of non-face-to-face time during this encounter. The visit was conducted via Phone-medicine  Return in about 1 week (around 07/18/2019) for hrob. in person.  Future Appointments  Date Time Provider [place]  07/20/2019  4:00 PM [place], [place] for [employer], [place]

## 2019-07-18 ENCOUNTER — Ambulatory Visit (INDEPENDENT_AMBULATORY_CARE_PROVIDER_SITE_OTHER): Payer: Self-pay | Admitting: *Deleted

## 2019-07-18 ENCOUNTER — Ambulatory Visit: Payer: Self-pay

## 2019-07-18 ENCOUNTER — Ambulatory Visit (INDEPENDENT_AMBULATORY_CARE_PROVIDER_SITE_OTHER): Payer: Self-pay | Admitting: Obstetrics & Gynecology

## 2019-07-18 ENCOUNTER — Other Ambulatory Visit: Payer: Self-pay

## 2019-07-18 VITALS — BP 107/67 | HR 103

## 2019-07-18 DIAGNOSIS — O0933 Supervision of pregnancy with insufficient antenatal care, third trimester: Secondary | ICD-10-CM

## 2019-07-18 DIAGNOSIS — O24419 Gestational diabetes mellitus in pregnancy, unspecified control: Secondary | ICD-10-CM

## 2019-07-18 DIAGNOSIS — O09523 Supervision of elderly multigravida, third trimester: Secondary | ICD-10-CM

## 2019-07-18 DIAGNOSIS — Z789 Other specified health status: Secondary | ICD-10-CM

## 2019-07-18 DIAGNOSIS — O09529 Supervision of elderly multigravida, unspecified trimester: Secondary | ICD-10-CM

## 2019-07-18 DIAGNOSIS — O99213 Obesity complicating pregnancy, third trimester: Secondary | ICD-10-CM

## 2019-07-18 DIAGNOSIS — Z3A32 32 weeks gestation of pregnancy: Secondary | ICD-10-CM

## 2019-07-18 DIAGNOSIS — O99210 Obesity complicating pregnancy, unspecified trimester: Secondary | ICD-10-CM | POA: Insufficient documentation

## 2019-07-18 DIAGNOSIS — O0930 Supervision of pregnancy with insufficient antenatal care, unspecified trimester: Secondary | ICD-10-CM | POA: Insufficient documentation

## 2019-07-18 DIAGNOSIS — O0993 Supervision of high risk pregnancy, unspecified, third trimester: Secondary | ICD-10-CM

## 2019-07-18 DIAGNOSIS — O0990 Supervision of high risk pregnancy, unspecified, unspecified trimester: Secondary | ICD-10-CM

## 2019-07-18 NOTE — Progress Notes (Signed)
Interpreter Otila Kluver present for encounter.

## 2019-07-18 NOTE — Progress Notes (Signed)
   PRENATAL VISIT NOTE  Subjective:  Brooke Reid is a 43 y.o. G2P1001 at [redacted]w[redacted]d being seen today for ongoing prenatal care.  She is currently monitored for the following issues for this high-risk pregnancy and has Supervision of high risk pregnancy, antepartum; AMA (advanced maternal age) multigravida 35+, unspecified trimester; Language barrier; Gestational diabetes; Obesity in pregnancy; and Late prenatal care on their problem list.  Patient reports no complaints.  Contractions: Not present. Vag. Bleeding: None.  Movement: Present. Denies leaking of fluid.   The following portions of the patient's history were reviewed and updated as appropriate: allergies, current medications, past family history, past medical history, past social history, past surgical history and problem list.   Objective:   Vitals:   07/18/19 1327  BP: 107/67  Pulse: (!) 103    Fetal Status: Fetal Heart Rate (bpm): NST   Movement: Present     General:  Alert, oriented and cooperative. Patient is in no acute distress.  Skin: Skin is warm and dry. No rash noted.   Cardiovascular: Normal heart rate noted  Respiratory: Normal respiratory effort, no problems with respiration noted  Abdomen: Soft, gravid, appropriate for gestational age.  Pain/Pressure: Present     Pelvic: Cervical exam deferred        Extremities: Normal range of motion.  Edema: None  Mental Status: Normal mood and affect. Normal behavior. Normal judgment and thought content.   Assessment and Plan:  Pregnancy: G2P1001 at [redacted]w[redacted]d 1. Supervision of high risk pregnancy, antepartum  2. Gestational diabetes mellitus (GDM), antepartum, gestational diabetes method of control unspecified - She will see Bev this week, starting weekly BPPs today - has follow up u/s at [facility] this week - I gave her the news that she has GDM   3. AMA (advanced maternal age) multigravida 51+, unspecified trimester  4. Language barrier - live interpretor  5. Obesity in  pregnancy  6. Late prenatal care   Preterm labor symptoms and general obstetric precautions including but not limited to vaginal bleeding, contractions, leaking of fluid and fetal movement were reviewed in detail with the patient. Please refer to After Visit Summary for other counseling recommendations.   No follow-ups on file.  Future Appointments  Date Time Provider [place]  07/18/2019  2:15 PM Emily Filbert, MD [facility] WOC  07/18/2019  2:35 PM [facility] [place]  07/20/2019  4:00 PM WOC-EDUCATION [facility] [place]  07/25/2019 10:15 AM Emily Filbert, MD W[facility] WOC  07/25/2019 11:15 AM WOC-[facility] NST [facility]OC-[facility] WOC  08/01/2019  8:15 AM Woodroe Mode, MD [facility] WOC  08/01/2019  9:15 AM WOC-[facility]OCA NST WOC-W[facility] WOC  08/08/2019  8:15 AM Donnamae Jude, MD [place]  08/08/2019  9:15 AM WOC-[facility] NST WOC-W[facility] WOC  08/15/2019  8:15 AM Donnamae Jude, MD [place]  08/15/2019  9:15 AM WOC-[facility]OCA NST WOC-WOCA WOC  08/22/2019  8:15 AM Aletha Halim, MD [facility] WOC  08/22/2019  9:15 AM WOC-[facility] NST WOC-[facility] WOC    Sanav Remer Marijo Sanes, MD

## 2019-07-20 ENCOUNTER — Encounter: Payer: Self-pay | Attending: Obstetrics & Gynecology | Admitting: *Deleted

## 2019-07-20 ENCOUNTER — Ambulatory Visit: Payer: Self-pay | Admitting: *Deleted

## 2019-07-20 ENCOUNTER — Other Ambulatory Visit: Payer: Self-pay

## 2019-07-20 DIAGNOSIS — O24419 Gestational diabetes mellitus in pregnancy, unspecified control: Secondary | ICD-10-CM | POA: Insufficient documentation

## 2019-07-20 DIAGNOSIS — Z3A00 Weeks of gestation of pregnancy not specified: Secondary | ICD-10-CM | POA: Insufficient documentation

## 2019-07-20 NOTE — Progress Notes (Signed)
Patient was seen on 07/20/2019 for  type 2 Diabetes and pregnancy self-management. EDD 09/14/2019. Patient speaks [place], live interpretor here for this visit.  Diet history obtained. Patient eats good variety of all food groups and beverages include water and coconut water (which has 15 g carbohydrate in 11 oz).  She states no training on Carbohydrate Counting. Patient is currently on no diabetes medications.  The following learning objectives were met by the patient :   States the definition of type 2 Diabetes and pregnancy   States why dietary management is important in controlling blood glucose  Describes the effects of carbohydrates on blood glucose levels  Demonstrates ability to create a balanced meal plan  Demonstrates carbohydrate counting   States when to check blood glucose levels  Demonstrates proper blood glucose monitoring techniques  States the effect of stress and exercise on blood glucose levels  States the importance of limiting caffeine and abstaining from alcohol and smoking  Plan:   Aim for 3 Carb Choices per meal (45 grams) +/- 1 either way   Aim for 1-2 Carbs per snack  Begin reading food labels for Total Carbohydrate of foods  Consider  increasing your activity level by walking or other activity daily as tolerated  Begin checking BG before breakfast and 2 hours after first bite of breakfast, lunch and dinner as directed by MD   Bring Log Book/Sheet and meter to every medical appointment   Patient not appropriate for Baby Scripts due to language barrier   Take medication as directed by MD  Blood glucose monitor given: [employer] Code Lot # [id number]  Blood glucose reading: 246 mg/dl I spoke with Dr. Idolina Primer, she has appt. with patient on Monday, August 3rd. She wants to see the patien's BG from now until then and decide on treatment options.  Patient instructed to monitor glucose levels: FBS: 60 - 95 mg/dl 2 hour: <120 mg/dl  Patient  received the following handouts: in [place]  Nutrition Diabetes and Pregnancy  Carbohydrate Counting List  BG Log Sheet  Patient will be seen for follow-up as needed.

## 2019-07-23 DIAGNOSIS — O24419 Gestational diabetes mellitus in pregnancy, unspecified control: Secondary | ICD-10-CM

## 2019-07-23 DIAGNOSIS — E119 Type 2 diabetes mellitus without complications: Secondary | ICD-10-CM

## 2019-07-23 HISTORY — DX: Gestational diabetes mellitus in pregnancy, unspecified control: O24.419

## 2019-07-23 HISTORY — DX: Type 2 diabetes mellitus without complications: E11.9

## 2019-07-25 ENCOUNTER — Other Ambulatory Visit: Payer: Self-pay

## 2019-07-25 ENCOUNTER — Ambulatory Visit (INDEPENDENT_AMBULATORY_CARE_PROVIDER_SITE_OTHER): Payer: Self-pay | Admitting: Obstetrics & Gynecology

## 2019-07-25 ENCOUNTER — Ambulatory Visit: Payer: Self-pay

## 2019-07-25 ENCOUNTER — Ambulatory Visit (INDEPENDENT_AMBULATORY_CARE_PROVIDER_SITE_OTHER): Payer: Self-pay | Admitting: *Deleted

## 2019-07-25 VITALS — BP 117/78 | HR 107 | Wt 229.3 lb

## 2019-07-25 DIAGNOSIS — O0990 Supervision of high risk pregnancy, unspecified, unspecified trimester: Secondary | ICD-10-CM

## 2019-07-25 DIAGNOSIS — O99213 Obesity complicating pregnancy, third trimester: Secondary | ICD-10-CM

## 2019-07-25 DIAGNOSIS — O09529 Supervision of elderly multigravida, unspecified trimester: Secondary | ICD-10-CM

## 2019-07-25 DIAGNOSIS — O0933 Supervision of pregnancy with insufficient antenatal care, third trimester: Secondary | ICD-10-CM

## 2019-07-25 DIAGNOSIS — O09523 Supervision of elderly multigravida, third trimester: Secondary | ICD-10-CM

## 2019-07-25 DIAGNOSIS — Z3A33 33 weeks gestation of pregnancy: Secondary | ICD-10-CM

## 2019-07-25 DIAGNOSIS — O24419 Gestational diabetes mellitus in pregnancy, unspecified control: Secondary | ICD-10-CM

## 2019-07-25 DIAGNOSIS — O99210 Obesity complicating pregnancy, unspecified trimester: Secondary | ICD-10-CM

## 2019-07-25 DIAGNOSIS — O0993 Supervision of high risk pregnancy, unspecified, third trimester: Secondary | ICD-10-CM

## 2019-07-25 DIAGNOSIS — O0930 Supervision of pregnancy with insufficient antenatal care, unspecified trimester: Secondary | ICD-10-CM

## 2019-07-25 MED ORDER — INSULIN SYRINGES (DISPOSABLE) U-100 0.3 ML MISC: 3 refills | Status: DC

## 2019-07-25 MED ORDER — INSULIN NPH (HUMAN) (ISOPHANE) 100 UNIT/ML ~~LOC~~ SUSP: SUBCUTANEOUS | 3 refills | Status: DC

## 2019-07-25 NOTE — Progress Notes (Signed)
   PRENATAL VISIT NOTE  Subjective:  Brooke Reid is a 43 y.o. G2P1001 at [redacted]w[redacted]d being seen today for ongoing prenatal care.  She is currently monitored for the following issues for this high-risk pregnancy and has Supervision of high risk pregnancy, antepartum; AMA (advanced maternal age) multigravida 35+, unspecified trimester; Language barrier; Gestational diabetes; Obesity in pregnancy; and Late prenatal care on their problem list.  Patient reports no complaints.  Contractions: Not present. Vag. Bleeding: None.  Movement: Present. Denies leaking of fluid.   The following portions of the patient's history were reviewed and updated as appropriate: allergies, current medications, past family history, past medical history, past social history, past surgical history and problem list.   Objective:   Vitals:   07/25/19 1020  BP: 117/78  Pulse: (!) 107  Weight: 229 lb 4.8 oz (104 kg)    Fetal Status: Fetal Heart Rate (bpm): 156   Movement: Present     General:  Alert, oriented and cooperative. Patient is in no acute distress.  Skin: Skin is warm and dry. No rash noted.   Cardiovascular: Normal heart rate noted  Respiratory: Normal respiratory effort, no problems with respiration noted  Abdomen: Soft, gravid, appropriate for gestational age.  Pain/Pressure: Present     Pelvic: Cervical exam deferred        Extremities: Normal range of motion.  Edema: None  Mental Status: Normal mood and affect. Normal behavior. Normal judgment and thought content.   Assessment and Plan:  Pregnancy: G2P1001 at [redacted]w[redacted]d 1. Gestational diabetes mellitus (GDM), antepartum, gestational diabetes method of control unspecified - Bev will work her in tomorrow at 10:30 so that she can learn how to inject insulin - she will start with NPH 4 units sq with breakfast and at bedtime - start with weekly BPP today  2. Supervision of high risk pregnancy, antepartum   3. Obesity in pregnancy   4. AMA (advanced maternal  age) multigravida 41+, unspecified trimester   5. Late prenatal care   Preterm labor symptoms and general obstetric precautions including but not limited to vaginal bleeding, contractions, leaking of fluid and fetal movement were reviewed in detail with the patient. Please refer to After Visit Summary for other counseling recommendations.   No follow-ups on file.  Future Appointments  Date Time Provider [place]  07/25/2019 11:15 AM [facility]OC-[facility] NST [facility] WOC  08/01/2019  8:15 AM Woodroe Mode, MD [facility] WOC  08/01/2019  9:15 AM WOC-WOCA NST WOC-W[facility]A WOC  08/08/2019  8:15 AM Donnamae Jude, MD [place]  08/08/2019  9:15 AM WOC-WOCA NST WOC-WOCA WOC  08/15/2019  8:15 AM Donnamae Jude, MD [place]  08/15/2019  9:15 AM WOC-WOCA NST WOC-WOCA WOC  08/22/2019  8:15 AM Aletha Halim, MD WOC-[facility]A WOC  08/22/2019  9:15 AM WOC-[facility]A NST WOC-W[facility]A WOC    Jasalyn Frysinger Marijo Sanes, MD

## 2019-07-26 ENCOUNTER — Encounter: Payer: Self-pay | Attending: Obstetrics & Gynecology | Admitting: *Deleted

## 2019-07-26 ENCOUNTER — Ambulatory Visit: Payer: Self-pay | Admitting: *Deleted

## 2019-07-26 DIAGNOSIS — Z713 Dietary counseling and surveillance: Secondary | ICD-10-CM | POA: Insufficient documentation

## 2019-07-26 DIAGNOSIS — O24419 Gestational diabetes mellitus in pregnancy, unspecified control: Secondary | ICD-10-CM | POA: Insufficient documentation

## 2019-07-26 NOTE — Progress Notes (Signed)
Insulin Instruction  Patient speaks [place], we had live interpreter her today.  Patient was seen on 07/26/2019 for insulin instruction.  MD orders are: 4 units NPH in AM and                           4 units NPH at bedtime (I instructed to take at 8 PM) The following learning objectives were met by the patient during this visit:   Insulin Action of NPH insulins  Reviewed syringe & vial including # units per syringe and vial  Hygiene and storage  Drawing up single doses if using vials   Single dose   Rotation of Sites  Hypoglycemia- symptoms, causes, treatment choices  Record keeping and MD follow up  Patient demonstrated understanding of insulin administration by return demonstration. 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 She declined injecting her self today saying she was confident she would be able to do it at home  Patient received the following handouts:  Insulin Instruction Handout                                        Patient to start on insulin as Rx'd by MD She has follow up visit with MD next Monday, 08/01/2019  She asked if she should call this office if her BG didn't improve. I instructed her to call if they went over 150 mg/dl but that I expected them to improve and the follow up in less than a week with the MD was appropriate. If she needs to call the office, we would use the Language LIne to interpret. She expressed agreement.   Patient will be seen for follow-up by me as needed.

## 2019-07-27 NOTE — Progress Notes (Signed)
I have reviewed this chart and agree with the RN/CMA assessment and management.    Armin Yerger C Fransico Sciandra, MD, FACOG Attending Physician, Faculty Practice [facility]  

## 2019-08-01 ENCOUNTER — Ambulatory Visit (INDEPENDENT_AMBULATORY_CARE_PROVIDER_SITE_OTHER): Payer: Self-pay | Admitting: General Practice

## 2019-08-01 ENCOUNTER — Ambulatory Visit (INDEPENDENT_AMBULATORY_CARE_PROVIDER_SITE_OTHER): Payer: Self-pay | Admitting: Obstetrics & Gynecology

## 2019-08-01 ENCOUNTER — Other Ambulatory Visit: Payer: Self-pay

## 2019-08-01 ENCOUNTER — Ambulatory Visit: Payer: Self-pay

## 2019-08-01 VITALS — BP 107/74 | HR 110 | Temp 98.4°F | Wt 230.7 lb

## 2019-08-01 DIAGNOSIS — O09529 Supervision of elderly multigravida, unspecified trimester: Secondary | ICD-10-CM

## 2019-08-01 DIAGNOSIS — O24414 Gestational diabetes mellitus in pregnancy, insulin controlled: Secondary | ICD-10-CM

## 2019-08-01 DIAGNOSIS — Z3A34 34 weeks gestation of pregnancy: Secondary | ICD-10-CM

## 2019-08-01 DIAGNOSIS — O0993 Supervision of high risk pregnancy, unspecified, third trimester: Secondary | ICD-10-CM

## 2019-08-01 DIAGNOSIS — O0990 Supervision of high risk pregnancy, unspecified, unspecified trimester: Secondary | ICD-10-CM

## 2019-08-01 LAB — POCT URINALYSIS DIP (DEVICE)
Bilirubin Urine: NEGATIVE
Glucose, UA: 500 mg/dL — AB
Hgb urine dipstick: NEGATIVE
Ketones, ur: 40 mg/dL — AB
Leukocytes,Ua: NEGATIVE
Nitrite: NEGATIVE
Protein, ur: NEGATIVE mg/dL
Specific Gravity, Urine: 1.025 (ref 1.005–1.030)
Urobilinogen, UA: 0.2 mg/dL (ref 0.0–1.0)
pH: 6 (ref 5.0–8.0)

## 2019-08-01 MED ORDER — INSULIN NPH (HUMAN) (ISOPHANE) 100 UNIT/ML ~~LOC~~ SUSP: SUBCUTANEOUS | 3 refills | Status: DC

## 2019-08-01 MED ORDER — INSULIN ASPART 100 UNIT/ML ~~LOC~~ SOLN: SUBCUTANEOUS | 12 refills | Status: DC

## 2019-08-01 NOTE — Progress Notes (Signed)
Pt informed that the ultrasound is considered a limited OB ultrasound and is not intended to be a complete ultrasound exam.  Patient also informed that the ultrasound is not being completed with the intent of assessing for fetal or placental anomalies or any pelvic abnormalities.  Explained that the purpose of today's ultrasound is to assess for  BPP, presentation and AFI.  Patient acknowledges the purpose of the exam and the limitations of the study.    Haily Caley H RN BSN 08/01/19  

## 2019-08-01 NOTE — Patient Instructions (Signed)
Insulin Aspart injection ?y l thu?c g? INSULIN ASPART l m?t d?ng insulin nhn t?o. Thu?c ny lm gi?m l??ng ???ng trong mu. N l m?t insulin tc d?ng nhanh, b?t ??u c tc d?ng nhanh h?n insulin thng th??ng. Thu?c khng c tc d?ng lu di nh? insulin thng th??ng. Thu?c ny c th? ???c dng cho nh?ng m?c ?ch khc; hy h?i ng??i cung c?p d?ch v? y t? ho?c d??c s? c?a mnh, n?u qu v? c th?c m?c. (CC) NHN HI?U PH? BI?N: Fiasp, Fiasp [employer], Fiasp PenFill, NovoLog, NovoLog Flexpen, NovoLog PenFill Ti c?n ph?i bo cho ng??i cung c?p d?ch v? y t? c?a mnh ?i?u g tr??c khi dng thu?c ny? H? c?n bi?t li?u qu v? c b?t k? tnh tr?ng no sau ?y khng:  c nh?ng ??t h? ???ng huy?t  b?nh v? m?t, cc v?n ?? v? th? l?c  b?nh th?n  b?nh gan  pha?n ??ng b?t th???ng ho??c di? ??ng v??i insulin ho?c metacresol  pha?n ??ng b?t th???ng ho??c di? ??ng v??i ca?c d??c ph?m kha?c, th?c ph?m, thu?c nhu?m, ho??c ch?t ba?o qua?n  ?ang c thai ho??c ??nh co? thai  ?ang cho con bu? Ti nn s? d?ng thu?c ny nh? th? no? Thu?c ny ?? tim d??i da. Hy s? d?ng ?ng nh? ? ???c ch? d?n. Hy lm theo cc ch? d?n m bc s? ho?c chuyn vin y t? ? ni v?i qu v?. N?u qu v? ?ang dng Novolog, th nn b?t ??u b?a ?n c?a mnh n?i trong 5 ??n 10 pht sau khi tim thu?c. N?u qu v? ?ang dng Fiasp, th nn b?t ??u b?a ?n c?a mnh vo lc tim thu?c ho?c n?i trong 20 pht sau khi tim. Hy chu?n b? s?n th?c ?n tr??c khi tim. ??ng tr hon chuy?n ?n u?ng. Qu v? s? ???c by cch dng thu?c ny v cch ?i?u ch?nh cc li?u dng cho cc ho?t ??ng v tnh tr?ng b?nh. Khng ???c dng nhi?u insulin h?n li?u l??ng ? ???c k trong toa. Khng ???c dng nhi?u l?n ho?c t l?n h?n nh? ? ???c k trong toa. Lun lun ki?m tra hnh th?c c?a thu?c insulin tr??c khi qu v? dng thu?c. Thu?c ny ph?i trong su?t v khng mu, nh? l n??c. Khng ???c dng thu?c ny n?u thu?c b? v?n ??c, b? ??c l?i, c mu, ho?c c c?n  trong thu?c. N?u qu v? s? d?ng cy bt tim, hy b?o ??m tho b? v? b?c bn ngoi kim tim tr??c khi s? d?ng li?u thu?c. ?i?u quan tr?ng l qu v? ph?i b? kim tim v ?ng tim ? dng vo thng chuyn ??ng v?t bn nh?n. Khng ???c b? chng vo thng rc thng th??ng. N?u qu v? khng c thng chuyn ??ng v?t bn nh?n, th hy g?i t?i d??c s? ho?c [place] vin y t? c?a mnh ?? xin m?t ci. Hy bn v?i bc s? nhi khoa c?a qu v? v? vi?c dng thu?c ny ? tr? em. Thu?c ny c th? ???c k toa cho tr? em ch? m?i 2 tu?i trong nh?ng tr??ng h?p ch?n l?c, nh?ng c?n ph?i th?n tr?ng. Qu li?u: N?u qu v? cho r?ng mnh ? dng qu nhi?u thu?c ny, th hy lin l?c v?i trung tm ki?m sot ch?t ??c ho?c phng c?p c?u ngay l?p t?c. L?U : Thu?c ny ch? dnh ring cho qu v?. Khng chia s? thu?c ny v?i nh?ng ng??i khc. N?u ti l? qun m?t li?u th sao? ?i?u quan tr?ng l  khng nn b? l? li?u thu?c no. Bc s? ho?c chuyn vin y t? s? th?o lu?n v?i qu v? v? k? ho?ch dnh cho cc li?u thu?c b? l? qun. N?u qu v? l? qun m?t li?u thu?c, th hy lm theo k? ho?ch c?a h?. Khng ???c dng li?u g?p ?i ho?c dng thm li?u. Nh?ng g c th? t??ng tc v?i thu?c ny?  cc thu?c khc dng ?? tr? b?nh ti?u ???ng Nhi?u thu?c c th? lm t?ng ho?c h? ???ng huy?t, nh?ng thu?c ny bao g?m:  ?? u?ng c r??u  cc thu?c dng ?? tr? nhi?m HIV ho?c AIDS  aspirin v cc thu?c gi?ng aspirin  m?t s? thu?c dng cho ch?ng tr?m c?m, tnh tr?ng lo u, ho?c cc r?i nhi?u tm th?n  chromium  cc thu?c l?i ti?u  cc n?i ti?t t? n?, ch?ng h?n nh? estrogens ho?c progestins, v vin thu?c ng?a thai  cc thu?c dng cho tim  isoniazid  cc d??c ph?m g?i l ch?t ?c ch? men monoamine oxidase (MAO), ch?ng h?n nh? [place], [place]ate, Marplan, Eldepryl, Carbex  cc n?i ti?t t? nam ho?c cc thu?c steroid ??ng ha  cc thu?c gip gi?m cn  cc thu?c dng cho d? ?ng, hen suy?n, c?m l?nh, ho?c ho  niacin  Cc thu?c khng vim khng ph?i  steroid (Non steroidal anti-inflamation drug - NSAID), cc thu?c gi?m ?au v khng vim, ch?ng h?n nh? ibuprofen, naproxen  octreotide  pentamidine  phenytoin  probenecid  cc thu?c khng sinh nhm quinolone, ch?ng h?n nh? ciprofloxacin, levofloxacin, ofloxacin  m?t s? ch? ph?m b? sung thu?c ti?t th?c th?o d??c  cc thu?c steroid, ch?ng h?n nh? prednisone ho?c cortisone  trimethoprim-sulfamethoxazole  cc thu?c dng cho tuy?n gip tr?ng M?t s? thu?c c th? che l?p cc tri?u ch?ng c?nh bo v? m?c ???ng huy?t th?p. Qu v? c th? c?n ph?i theo di m?c ???ng huy?t c?a mnh ch?t ch? h?n, n?u qu v? ?ang dng m?t trong s? nh?ng thu?c ny. Nh?ng thu?c ny bao g?m:  cc thu?c ch?n beta, ch?ng h?n nh? atenolol, metoprolol, propranolol v cc thu?c khc  clonidine  guanethidine  reserpine Danh sch ny c th? khng m t? ?? h?t cc t??ng tc c th? x?y ra. Hy ??a cho ng??i cung c?p d?ch v? y t? c?a mnh danh sch t?t c? cc thu?c, th?o d??c, cc thu?c khng c?n toa, ho?c cc ch? ph?m b? sung m qu v? dng. C?ng nn bo cho h? bi?t r?ng qu v? c ht thu?c, u?ng r??u, ho?c c s? d?ng ma ty tri php hay khng. Vi th? c th? t??ng tc v?i thu?c c?a qu v?. Ti c?n ph?i theo di ?i?u g trong khi dng thu?c ny? Hy ?i g?p bc s? ho?c [place] vin y t? ?? theo di ??nh k? s? c?i thi?n c?a qu v?. M?t xt nghi?m g?i l HbA1C (A1C) s? ???c gim st. ?y l m?t xt nghi?m mu ??n gi?n. N l??ng ??nh s? ki?m sot m?c ???ng huy?t c?a qu v? trong vng 2 ??n 3 thng qua. Qu v? s? ???c lm xt nghi?m ny m?i 3 ??n 6 thng. H?c cch ki?m tra ???ng huy?t c?a mnh. Tm hi?u v? cc tri?u ch?ng ???ng huy?t th?p v cao, v cch ki?m sot chng. Lun lun mang theo ngu?n cung c?p ???ng c?p t?c ?? phng khi qu v? c tri?u ch?ng h? ???ng huy?t. V d? nh? k?o c?ng ho?c cc vin glucose. Hy ch?c ch?n r?ng nh?ng ng??i khc  bi?t l qu v? c th? b? ngh?n n?u qu v? ?n ho?c u?ng khi c cc tri?u ch?ng h? ???ng  huy?t n?ng, ch?ng h?n nh? co gi?t ho?c b?t t?nh. H? ph?i tm h? tr? y t? ngay l?p t?c. Hy bo cho bc s? ho?c [place] vin y t? c?a mnh, n?u qu v? c m?c ???ng huy?t cao. Qu v? c th? c?n ph?i thay ??i li?u dng c?a thu?c ny. N?u qu v? b? b?nh ho?c luy?n t?p nhi?u h?n bnh th??ng, th qu v? c th? c?n thay ??i li?u dng c?a thu?c ny. Khng ???c b? qua cc b?a ?n. Hy h?i bc s? ho?c chuyn vin y t? c?a mnh ?? xem qu v? c nn trnh r??u hay khng. Nhi?u ch? ph?m ch?a ho v c?m l?nh lo?i khng c?n toa c ch?a ???ng ho?c r??u. Nh?ng th? ny c th? ?nh h??ng ??n ???ng huy?t. Hy ch?c ch?n r?ng qu v? c ?ng lo?i ?ng tim dnh cho lo?i insulin m qu v? dng. C? g?ng khng thay ??i th??ng ph?m v ch?ng lo?i c?a insulin ho?c ?ng tim, tr? khi bc s? ho?c chuyn vin y t? ni qu v? thay ??i. Vi?c thay ??i th??ng ph?m ho?c ch?ng lo?i c?a insuline c th? gy ra tnh tr?ng ???ng huy?t cao ho?c th?p m?t cch nguy hi?m. Lun lun d? tr? s?n insulin, ?ng tim v kim tim. Hy dng ?ng tim ch? m?t l?n m thi. V?t b? ?ng tim v kim tim trong v?t ch?a kn ?? trnh v tnh b? kim ?m. Khng bao gi? ???c xi chung ch? cartridge v cy bt tim thu?c insulin. Ngay c? khi ? thay kim tim, vi?c xi chung c th? lm ly truy?n virus, ch?ng h?n nh? virus vim gan ho?c HIV. M?i l?n qu v? nh?n ???c h?p kim dng cho bt tim m?i, hy ki?m tra xem kim c gi?ng nh? lo?i m qu v? ? ???c hu?n luy?n cch s? d?ng hay khng. N?u khng, th hy yu c?u bc s? ho?c [place] vin y t? c?a mnh by Reynolds Americancho qu v? lm sao s? d?ng ?ng cch lo?i kim m?i ny. Hy ?eo vng tay ho?c dy chuy?n c th? ghi ch thng tin y t? c nhn (medical identification). Hy mang theo th? ghi thng tin y t? c nhn (medical identification) c ghi ch cc chi ti?t v? tnh tr?ng c?a qu v?, cc tn thu?c, v tn bc s? c?a qu v?. Ti c th? nh?n th?y nh?ng tc d?ng ph? no khi dng thu?c ny? Nh?ng tc d?ng ph? qu v? c?n ph?i bo cho bc s? ho?c  chuyn vin y t? cng s?m cng t?t:  cc ph?n ?ng d? ?ng, ch?ng h?n nh? da b? m?n ??, ng?a, n?i my ?ay, s?ng ? m?t, mi, ho?c l??i  kh th?  cc d?u hi?u v tri?u ch?ng ???ng huy?t cao nh? l chng m?t, kh mi?ng, kh da, h?i th? c mi tri cy, bu?n i, ?au bao t?, ?i ho?t kht h?n, ?i ti?u nhi?u h?n  cc d?u hi?u v tri?u ch?ng h? ???ng huy?t nh? l c?m th?y lo l?ng, l l?n, chng m?t, ?i h?n, m?t m?i ho?c y?u ?t khc th??ng, v m? hi, run, l?nh, b?t r?t, ?au ??u, nhn m?, tim ??p nhanh, m?t  th?c Cc tc d?ng ph? khng c?n ph?i ch?m [place] y t? (hy bo cho bc s? ho?c chuyn vin y t?, n?u cc tc d?ng ph? ny ti?p di?n ho?c gy phi?n toi):  t?ng ho?c gi?m cc m m? d??i da do tim qu nhi?u l?n ? cng m?t ch?  ng?a, nng rt, s?ng ho?c m?n ?? ? ch? tim Danh sch ny c th? khng m t? ?? h?t cc tc d?ng ph? c th? x?y ra. Xin g?i t?i bc s? c?a mnh ?? ???c c? v?n chuyn mn v? cc tc d?ng ph?[place]. Qu v? c th? t??ng trnh cc tc d?ng ph? cho FDA theo s? [phone]. Ti nn c?t gi? thu?c c?a mnh ? ?u? ?? ngoi t?m tay tr? em. C?t gi? cc ?ng thu?c ch?a khui trong t? l?nh t? 2 ??n 8 ?? C (38 ??n 48 ?? F). Khng ???c lm ?ng l?nh ho?c s? d?ng insulin ? b? ?ng l?nh C?t gi? cc ?ng thu?c ? khui (cc ?ng thu?c hi?n ?ang dng) trong t? l?nh ho?c ? nhi?t ?? phng d??i 30 ?? C (88 ?? F). Gi? insulin ? nhi?t ?? phng s? lm b?t ?au khi tim thu?c. M?t khi ? khui, insulin c th? ???c dng trong 28 ngy. Sau 28 ngy, nn v?t b? ?ng thu?c. C?t cc ?ng thu?c ch?a khui, cy bt tim Novolog FlexPens ho?c cy bt tim Fiasp FlexTouch trong t? l?nh t? 2 ??n 8 ?? C (38 ??n 48 ?? F). Khng ???c lm ?ng l?nh ho?c s? d?ng insulin ? b? ?ng l?nh M?t khi ? khui, th Novolog FlexPen v cc ?ng thu?c ? ???c n?p vo trong cy bt tim ph?i ???c gi? ? nhi?t ?? phng, kho?ng 25 ?? C (79 ?? F) ho?c mt h?n cho t?i 28 ngy; khng ???c c?t trong t? l?nh. Cy bt tim Fiasp FlexTouch ? khui c th? c?t  gi? ? nhi?t ?? phng ho?c ??p l?nh cho t?i 28 ngy. Sau 28 ngy, nn v?t b? h?t th?y insulin ch?a dng c trong b?t k? ch? ph?m ? khui no. [place] ch? qu nng v nh sng. V?t b? t?t c? thu?c ch?a dng sau ngy h?t h?n ho?c sau khi th?i gian c? th? ?? c?t gi? ? nhi?t ?? phng ? h?t. L?U : ?y l b?n tm t?t. N c th? khng bao hm t?t c? thng tin c th? c. N?u qu v? th?c m?c v? thu?c ny, xin trao ??i v?i bc s?, d??c s?, ho?c ng??i cung c?p d?ch v? y t? c?a mnh.  2020 Elsevier/Gold Standard (2019-02-08 00:00:00)

## 2019-08-01 NOTE — Progress Notes (Signed)
Pt has Glucose log on paper.Gave Pt Test strips.Pt is [employer].

## 2019-08-01 NOTE — Progress Notes (Signed)
   PRENATAL VISIT NOTE  Subjective:  Brooke Reid is a 43 y.o. G2P1001 at [redacted]w[redacted]d being seen today for ongoing prenatal care.  She is currently monitored for the following issues for this high-risk pregnancy and has Supervision of high risk pregnancy, antepartum; AMA (advanced maternal age) multigravida 35+, unspecified trimester; Language barrier; Gestational diabetes; Obesity in pregnancy; and Late prenatal care on their problem list.  Patient reports she was only able to start insulin 3 days ago.  Contractions: Irritability. Vag. Bleeding: None.  Movement: Present. Denies leaking of fluid.   The following portions of the patient's history were reviewed and updated as appropriate: allergies, current medications, past family history, past medical history, past social history, past surgical history and problem list.   Objective:   Vitals:   08/01/19 0823  BP: 107/74  Pulse: (!) 110  Temp: 98.4 F (36.9 C)  Weight: 230 lb 11.2 oz (104.6 kg)    Fetal Status: Fetal Heart Rate (bpm): 147   Movement: Present     General:  Alert, oriented and cooperative. Patient is in no acute distress.  Skin: Skin is warm and dry. No rash noted.   Cardiovascular: Normal heart rate noted  Respiratory: Normal respiratory effort, no problems with respiration noted  Abdomen: Soft, gravid, appropriate for gestational age.  Pain/Pressure: Present     Pelvic: Cervical exam deferred        Extremities: Normal range of motion.  Edema: None  Mental Status: Normal mood and affect. Normal behavior. Normal judgment and thought content.   Assessment and Plan:  Pregnancy: G2P1001 at [redacted]w[redacted]d 1. Insulin controlled gestational diabetes mellitus (GDM) in third trimester FBS 101-150 and PP 130-195, need to increase insulin and add aspart  - insulin aspart (NOVOLOG) 100 UNIT/ML injection; 10 units [place] at breakfast and 6 units at supper  Dispense: 10 mL; Refill: 12 - insulin NPH Human (NOVOLIN N) 100 UNIT/ML injection; Inject 20  units [place] with breakfast and 10 units at supper  Dispense: 10 mL; Refill: 3  Preterm labor symptoms and general obstetric precautions including but not limited to vaginal bleeding, contractions, leaking of fluid and fetal movement were reviewed in detail with the patient. Please refer to After Visit Summary for other counseling recommendations.   Return in about 1 week (around 08/08/2019).  Future Appointments  Date Time Provider [place]  08/01/2019  9:15 AM [facility] NST [facility] WOC  08/01/2019 10:15 AM [facility] IMAGING [facility] WOC  08/08/2019  8:15 AM Donnamae Jude, MD [facility] WOC  08/08/2019  9:15 AM WOC-[facility] NST [facility] WOC  08/15/2019  8:15 AM Donnamae Jude, MD [place]  08/15/2019  9:15 AM WOC-[facility] NST WOC-[facility] WOC  08/22/2019  8:15 AM Aletha Halim, MD [facility] WOC  08/22/2019  9:15 AM [facility] NST [facility]OC-[facility] WOC    Emeterio Reeve, MD

## 2019-08-08 ENCOUNTER — Ambulatory Visit (INDEPENDENT_AMBULATORY_CARE_PROVIDER_SITE_OTHER): Payer: Self-pay | Admitting: Family Medicine

## 2019-08-08 ENCOUNTER — Ambulatory Visit (INDEPENDENT_AMBULATORY_CARE_PROVIDER_SITE_OTHER): Payer: Self-pay | Admitting: *Deleted

## 2019-08-08 ENCOUNTER — Ambulatory Visit: Payer: Self-pay

## 2019-08-08 ENCOUNTER — Other Ambulatory Visit: Payer: Self-pay

## 2019-08-08 VITALS — BP 131/87 | HR 108 | Temp 98.6°F | Wt 231.0 lb

## 2019-08-08 DIAGNOSIS — O24414 Gestational diabetes mellitus in pregnancy, insulin controlled: Secondary | ICD-10-CM

## 2019-08-08 DIAGNOSIS — O99213 Obesity complicating pregnancy, third trimester: Secondary | ICD-10-CM

## 2019-08-08 DIAGNOSIS — O09529 Supervision of elderly multigravida, unspecified trimester: Secondary | ICD-10-CM

## 2019-08-08 DIAGNOSIS — Z3A35 35 weeks gestation of pregnancy: Secondary | ICD-10-CM

## 2019-08-08 DIAGNOSIS — O99210 Obesity complicating pregnancy, unspecified trimester: Secondary | ICD-10-CM

## 2019-08-08 DIAGNOSIS — O0930 Supervision of pregnancy with insufficient antenatal care, unspecified trimester: Secondary | ICD-10-CM

## 2019-08-08 DIAGNOSIS — O0990 Supervision of high risk pregnancy, unspecified, unspecified trimester: Secondary | ICD-10-CM

## 2019-08-08 DIAGNOSIS — O0993 Supervision of high risk pregnancy, unspecified, third trimester: Secondary | ICD-10-CM

## 2019-08-08 DIAGNOSIS — O09523 Supervision of elderly multigravida, third trimester: Secondary | ICD-10-CM

## 2019-08-08 DIAGNOSIS — Z789 Other specified health status: Secondary | ICD-10-CM

## 2019-08-08 DIAGNOSIS — O0933 Supervision of pregnancy with insufficient antenatal care, third trimester: Secondary | ICD-10-CM

## 2019-08-08 LAB — POCT URINALYSIS DIP (DEVICE)
Bilirubin Urine: NEGATIVE
Glucose, UA: NEGATIVE mg/dL
Hgb urine dipstick: NEGATIVE
Ketones, ur: 40 mg/dL — AB
Nitrite: NEGATIVE
Protein, ur: NEGATIVE mg/dL
Specific Gravity, Urine: 1.025 (ref 1.005–1.030)
Urobilinogen, UA: 0.2 mg/dL (ref 0.0–1.0)
pH: 6.5 (ref 5.0–8.0)

## 2019-08-08 MED ORDER — INSULIN NPH (HUMAN) (ISOPHANE) 100 UNIT/ML ~~LOC~~ SUSP: SUBCUTANEOUS | 3 refills | Status: DC

## 2019-08-08 MED ORDER — INSULIN ASPART 100 UNIT/ML ~~LOC~~ SOLN: SUBCUTANEOUS | 12 refills | Status: DC

## 2019-08-08 MED ORDER — ACCU-CHEK FASTCLIX LANCETS MISC: 1.0000 [IU] | Freq: Four times a day (QID) | 12 refills | Status: DC

## 2019-08-08 NOTE — Progress Notes (Addendum)
    Subjective:  Brooke Reid is a 43 y.o. G2P1001 at [redacted]w[redacted]d being seen today for ongoing prenatal care.  She is currently monitored for the following issues for this high-risk pregnancy and has Supervision of high risk pregnancy, antepartum; AMA (advanced maternal age) multigravida 35+, unspecified trimester; Language barrier; Gestational diabetes; Obesity in pregnancy; and Late prenatal care on their problem list.  Patient reports no complaints.  Contractions: Irritability. Vag. Bleeding: None.  Movement: Present. Denies leaking of fluid.   AM fasting ranges 85-105, 2/7 out of range Post prandial ranges 105-169, 16/18 out of range with no particular clustering at any time of day  The following portions of the patient's history were reviewed and updated as appropriate: allergies, current medications, past family history, past medical history, past social history, past surgical history and problem list. Problem list updated.  Objective:   Vitals:   08/08/19 0810  BP: 131/87  Pulse: (!) 108  Temp: 98.6 F (37 C)  Weight: 231 lb (104.8 kg)    Fetal Status: Fetal Heart Rate (bpm): 149 Fundal Height: 40 cm Movement: Present     General:  Alert, oriented and cooperative. Patient is in no acute distress.  Skin: Skin is warm and dry. No rash noted.   Cardiovascular: Normal heart rate noted  Respiratory: Normal respiratory effort, no problems with respiration noted  Abdomen: Soft, gravid, appropriate for gestational age. Pain/Pressure: Present     Pelvic: Vag. Bleeding: None     Cervical exam deferred        Extremities: Normal range of motion.  Edema: None  Mental Status: Normal mood and affect. Normal behavior. Normal judgment and thought content.   Urinalysis:      Assessment and Plan:  Pregnancy: G2P1001 at [redacted]w[redacted]d  1. Insulin controlled gestational diabetes mellitus (GDM) in third trimester Poor control of sugars Adjust NPH from >15/15 Adjust aspart from 10/6 > 20/20/20 Discussed  need for growth [place] next week as well as possibility of early term induction depending on EFW and degree of GDM control - insulin aspart (NOVOLOG) 100 UNIT/ML injection; 20 units [place] at breakfast, lunch, and dinner  Dispense: 10 mL; Refill: 12 - insulin NPH Human (NOVOLIN N) 100 UNIT/ML injection; Inject 15 units [place] with breakfast and dinner  Dispense: 10 mL; Refill: 3 NST:  Baseline: 135 bpm, Variability: Good {> 6 bpm), Accelerations: Reactive and Decelerations: Absent   2. Supervision of high risk pregnancy, antepartum BPP today, last week 8/10, -2 for NST BPP and growth next week  3. Language barrier [place]  4. AMA (advanced maternal age) multigravida 36+, unspecified trimester   5. Obesity in pregnancy   6. Late prenatal care Screening updated  Preterm labor symptoms and general obstetric precautions including but not limited to vaginal bleeding, contractions, leaking of fluid and fetal movement were reviewed in detail with the patient. Please refer to After Visit Summary for other counseling recommendations.  Return in 1 week (on 08/15/2019) for [facility], needs MD, in person, OB visit and BPP.   Clarnce Flock, MD

## 2019-08-08 NOTE — Progress Notes (Signed)

## 2019-08-08 NOTE — Patient Instructions (Addendum)
Change NPH (long acting insulin) to 15 unit in the morning and 15 units at night Change aspart insulin (short acting) to 20 units with breakfast, lunch, and dinner   B?nh ti?u ???ng [place]?, t? ch?m [place] Gestational Diabetes Mellitus, Self Care Ch?m [place] cho b?n thn sau khi qu v? c ch?n ?on ti?u ???ng [place]? (b?nh ti?u ???ng [place]?) c ngh?a l gi? cho l??ng ???ng (glucose) huy?t n?m trong t?m ki?m sot. Qu v? c th? lm ?i?u ? v?i s? cn b?ng gi?a:  Dinh d??ng.  T?p luy?n.  Thay ??i l?i s?ng.  Cc lo?i thu?c ho?c insulin, n?u c?n.  H? tr? t? nhm chuyn gia ch?m [place] s?c kh?e v nh?ng ng??i khc. Thng tin sau ?y gi?i thch v? nh?ng g qu v? c?n bi?t ?? qu?n l ti?u ???ng [place]? ? nh. C nh?ng nguy c? no? N?u ti?u ???ng [place]? ???c ?i?u tri?, b?nh h?u nh? khng gy ra v?n ?? g. N?u khng ????c ki?m sot b?ng ?i?u tri?, b?nh co? th? gy ra cc v?n ?? trong lu?c chuy?n da? va? sinh con va? m?t s? trong nh??ng v?n ?? na?y co? th? co? ha?i cho [place] nhi (ba?o [place]) va? ng???i me?Marland Kitchen. Ti?u ????ng [place]? khng ????c ki?m soa?t cu?ng co? th? la?m cho tre? s? sinh co? v?n ?? v? h h?p va? l???ng ????ng huy?t th?p. Phu? n?? bi? ti?u ????ng [place]? d? bi? b?nh na?y h?n n?u ho? la?i mang thai l?n n??a va? ho? d? bi? b?nh ti?u ????ng ti?p 2 trong t??ng lai. Lm th? no ?? theo di ???ng huy?t   Ki?m tra l??ng ???ng huy?t m?i ngy trong qu tnh mang thai c?a qu v?. Th?c hi?n vi?c ny th??ng xuyn theo ch? d?n c?a chuyn gia ch?m [place] s?c kh?e.  Lin h? v?i chuyn gia ch?m [place] s?c kh?e n?u l??ng ???ng huy?t c?a qu v? cao h?n m?c tiu trong hai l?n th? lin ti?p. Chuyn gia ch?m so?c s??c kho?e cu?a quy? vi? co? th? ???t ca?c mu?c tiu ?i?u tri? ring cho quy? vi?. Nhn chung, mu?c tiu ?i?u tri? la? duy tri? l??ng ???ng huy?t sau ?y trong lu?c mang thai:  Tr???c khi ?n (tr???c b??a ?n): t? 95 mg/dL (5,3 mmol/L) tr? xu?ng.  Sau b??a ?n (sau khi  ?n): ? M?t gi?? sau b??a ?n: t? 140 mg/dL (7,8 mmol/L) tr? xu?ng. ? Hai gi?? sau b??a ?n: t? 120 mg/dL (6,7 mmol/L) tr? xu?ng.  N?ng ?? A1c (hemoglobin A1c): 6-6,5%. Lm cch no ?? qu?n l t?ng ???ng huy?t v h? ???ng huy?t Cc tri?u ch?ng t?ng ???ng huy?t T?ng ???ng huy?t, hay cn g?i l ???ng huy?t cao, x?y ra khi l??ng ???ng huy?t qu cao. Hy ??m b?o qu v? bi?t cc d?u hi?u s?m c?a t?ng ???ng huy?t, ch?ng h?n nh?:  Kht t?ng ln.  ?i.  C?m th?y r?t m?t m?i.  C?n ph?i ti?u ti?n th??ng xuyn h?n bnh th??ng.  Nhn m?. Cc tri?u ch?ng h? ???ng huy?t H? ???ng huy?t, hay cn g?i l ???ng huy?t th?p, x?y ra khi l??ng ???ng huy?t t? 70 mg/dL (3,9 mmol/L) tr? xu?ng. Nguy c? h? ???ng huy?t gia t?ng trong ho??c sau khi t?p th? d?c, trong lu?c ngu?, khi bi? ?m va? khi bo? b??a ho??c nh?n ?n trong th?i gian di (nh?n ?i). Cc tri?u ch?ng c th? bao g?m:  ?i.  Lo u.  ?? m? hi v c?m th?y ?m ??t.  L l?n.  Chng m?t  ho?c c?m th?y chong vng.  Bu?n ng?.  Bu?n nn.  Nh?p tim t?ng.  ?au ??u.  Nhn m?.  D? b? kch thch.  ?au nhi ho?c t ? quanh mi?ng, mi ho?c l??i.  Thay ??i s?? ph?i h??p.  Ng? khng yn.  Ng?t x?u.  Co gi?t. ?i?u quan tr?ng l bi?t cc tri?u ch?ng c?a h? ???ng huy?t v x? tr ngay. Lun mang bn mnh 15 gam ?? ?n nh? c carbohydrate tc d?ng nhanh ?? x? tr tnh tr?ng ???ng huy?t th?p. Cc thnh vin gia ?nh v b?n b thn c?n c?ng c?n bi?t tri?u ch?ng v hi?u cch x? tr h? ???ng huy?t, trong tr??ng h?p qu v? khng th? t? x? tr cho mnh. ?i?u tr? h? ???ng huy?t N?u qu v? t?nh to v c th? nu?t m?t cch an ton, hy theo quy t?c 15:15:  Dng 15 gam carbohydrate c tc d?ng nhanh. Trao ??i v?i chuyn gia ch?m [place] s?c kh?e v? l??ng m qu v? nn dng.  Cc l?a ch?n tc d?ng nhanh bao g?m: ? Vin glucose (dng 15 gam). ? 6-8 vin k?o c?ng. ? 4-6 ao-x? (120-150 mL) n??c p tri cy. ? 4-6 ao-x? (120-150 mL) soda th???ng (khng pha?i  loa?i cho ng???i ?n king). ? 1 tha canh (15 mL) m?t ong ho?c ???ng.  Ki?m tra ???ng huy?t c?a qu v? sau khi du?ng carbohydrate 15 pht.  N?u l??ng ???ng huy?t ?o l?i v?n t? 70 mg/dL (3,9 mmol/L) tr? xu?ng, ha?y la?i dng ti?p 15 gam carbohydrate.  N?u l??ng ???ng huy?t cu?a quy? vi? khng t?ng qu 70 mg/dL (3,9 mmol/L) sau 3 l?n th??, hy ?i khm c?p c?u.  Sau khi l??ng ???ng huy?t tr? l?i bnh th??ng, ha?y ?n m?t b?a ?n chi?nh ho?c m?t b??a ?n nh? trong vng 1 ti?ng. ?i?u tr? h? ???ng huy?t n?ng H? ???ng huy?t n?ng l khi l??ng ???ng huy?t c?a qu v? t? 54 mg/dL (3 mmol/L) tr? xu?ng. H? ???ng huy?t n?ng l m?t tr??ng h?p c?p c?u. Khng ch? xem tri?u ch?ng c h?t khng. Hy ?i khm ngay l?p t?c. G?i cho d?ch v? c?p c?u t?i ??a ph??ng (911 ? [place] N?u qu v? b? h? ???ng huy?t n?ng v qu v? khng th? ?n ho?c u?ng, qu v? c th? c?n tim glucagon. M?t ng??i trong gia ?nh ho?c b?n thn c?n h?c cch ki?m tra ???ng huy?t v cch tim glucagon cho qu v?. Hy h?i chuyn gia ch?m [place] s?c kh?e xem qu v? c c?n c s?n m?t b? ?? tim glucagon kh?n c?p khng. H? ???ng huy?t n?ng c th? c?n ???c ?i?u tr? trong b?nh vi?n. Vi?c ?i?u tr? c th? bao g?m truy?n glucose qua ???ng t?nh m?ch (IV). Qu v? c?ng c th? c?n ?i?u tr? nguyn nhn gy h? ???ng huy?t. Tun th? nh?ng h??ng d?n ny ? nh: U?ng thu?c ?i?u tr? ti?u ???ng nh? ???c ch? d?n  N?u chuyn gia ch?m [place] s?c kh?e ? k ??n insulin ho?c thu?c ?i?u tr? ti?u ???ng, hy dng cc thu?c ny hng ngy.  Khng ?? h?t insulin hay b?t ky? loa?i thu?c ?i?u tri? ti?u ????ng na?o kha?c m qu v? dng. Ln k? ho?ch tr??c ?? qu v? lun c s?n cc lo?i thu?c ny.  N?u quy? vi? du?ng insulin, hy ?i?u chi?nh li?u l??ng d??a trn m??c ?? hoa?t ??ng th? ch?t cu?a quy? vi? va? loa?i th??c ?n ma? quy? vi? ?n. Chuyn gia ch?m  s?c kh?e s? ni cho  qu v? cch ?i?u ch?nh li?u l??ng. L?a ch?n th?c ph?m lnh m?nh  Nh?ng th? qu v? ?n v u?ng ??u  ?nh h??ng ??n ???ng huy?t (v li?u insulin c?a qu v?, n?u p d?ng). ??a ra l?a ch?n t?t gip ki?m sot ti?u ???ng v ng?n ng?a cc v?n ?? s?c kh?e khc. K? ho?ch cho b?a ?n lnh m?nh bao g?m ?n protein th?t n?c, carbohydrate ph?c t?p, tri cy v rau c? t??i, s?n ph?m s?a t bo v ch?t bo lnh m?nh. Ha?y h?n g?p m?t chuyn gia v? ch? ?? ?n v dinh d??ng (chuyn gia dinh d??ng c ??ng k hnh ngh?) ?? gip qu v? ??a ra m?t k? ho?ch ?n u?ng ph h?p cho b?n thn. ??m b?o qu v?:  Tun th? ch? d?n c?a chuyn gia ch?m [place] s?c kh?e v? cc h?n ch? ?n ho?c u?ng.  U?ng ?? n??c ?? gi? cho n??c ti?u c mu vng nh?t.  ?n ca?c b??a ?n nh? lnh m?nh gi?a cc b?a ?n chi?nh.  Ghi l?i l??ng carbohydrate m qu v? ?n. Lm ?i?u ny b?ng cch ??c nhn th?c ph?m v ti?m hi?u ca?c kh?u ph?n th??c ?n tiu chu?n.  Lm theo k? ho?ch trong ngy b? b?nh c?a qu v? b?t c? lc no m qu v? khng th? ?n ho?c u?ng nh? bnh th??ng. L?p k? ho?ch ny tr??c v?i chuyn gia ch?m [place] s?c kh?e.  Tch c?c ho?t ??ng  Hoa?t ??ng th? ch?t 30 pht tr? ln m?i ngy, ho?c m?c ?? hoa?t ??ng th? ch?t theo khuy?n nghi? c?a chuyn gia ch?m so?c s??c kho?e trong th?i gian mang [place]. ? T?p th? d?c 10 pht b?t ??u sau m?i b?a ?n 30 pht c th? gip ki?m sot l??ng ???ng huy?t sau b??a ?n.  N?u quy? vi? b?t ??u ba?i t?p th? d?c ho?c ho?t ??ng m??i, ha?y lm vi?c v?i chuyn gia ch?m so?c s??c kho?e ?? ?i?u ch?nh l??ng insulin, thu?c ho?c l??ng th?c ?n ??a vo khi c?n thi?t. Co? nh??ng l??a cho?n l?i s?ng la?nh ma?nh  Khng u?ng r??u.  Khng s? d?ng b?t k? s?n ph?m thu?c l no, ch?ng ha?n nh? thu?c l d?ng ht, thu?c l d?ng nhai v thu?c l ?i?n t?. N?u qu v? c?n gip ?? ?? cai thu?c, hy h?i chuyn gia ch?m [place] s?c kh?e.  H?c cch qu?n l c?ng th?ng. N?u qu v? c?n gip ?? v? vi?c na?y, hy h?i chuyn gia ch?m [place] s?c kh?e cu?a quy? vi?. Ch?m [place] cho c? th? c?a quy? vi?  Hy tim chu?ng ??y ?u?.  ?nh r?ng va? l??i  hai l?n m?i nga?y va? du?ng chi? [place] khoa m?t l?n tr? ln m?i nga?y. ??n kha?m [place] si? 6 thng m?t l?n tr? ln.  Duy tr m?t cn n?ng kh?e m?nh trong su?t [place]?. H??ng d?n chung  Ch? s? d?ng thu?c khng k ??n v thu?c k ??n theo ch? d?n c?a chuyn gia ch?m [place] s?c kh?e.  Trao ??i v?i chuyn gia ch?m [place] s?c kh?e v? nguy c? b? huy?t p cao trong [place]? (ti?n s?n gi?t ho?c s?n gi?t).  Chia s? k? ho?ch qu?n l b?nh ti?u ???ng c?a qu v? ? n?i lm vi?c, tr??ng h?c va? gia ?i?nh quy? vi?.  Ki?m tra l??ng [place] n??c ti?u trong th?i gian mang [place] khi quy? vi? ?ang b? ?m v theo chi? d?n cu?a chuyn gia ch?m [place] s?c kh?e.  Mang theo th? c?nh bo y t? ho?c ?eo ?? trang  s?c c?nh bo y t? cho bi?t r?ng qu v? b? b?nh ti?u ???ng [place]?.  Tun thu? t?t c? ca?c l?n khm theo di trong su?t [place]? (tr???c khi sinh) va? sau khi sinh (sau sinh) theo chi? d?n cu?a chuyn gia ch?m so?c s??c kho?e. ?i?u ny c vai tr quan tr?ng. C d?ch v? ch?m [place] theo nhu c?u c?a qu v? sau khi sinh  Ki?m tra l??ng ???ng huy?t c?a quy? vi? sau khi sinh 4-12 tu?n. Vi?c ny ???c th?c hi?n b?ng m?t xe?t nghi?m dung na?p glucose ????ng u?ng (OGTT).  Sng l?c b?nh ti?u ???ng t nh?t 3 n?m m?t l?n, ho?c theo t?n su?t m chuyn gia ch?m [place] s?c kh?e yu c?u. ??a ra ca?c cu h?i v?i chuyn gia ch?m [place] s?c kh?e c?a qu v?  Ti co? c?n g??p m?t chuyn gia h???ng d?n v? b?nh ti?u ????ng khng?  Ti co? th? ti?m m?t nho?m h? tr?? ng???i bi? ti?u ????ng [place]? ?? ?u? N?i ?? tm thm thng tin ?? bi?t thm thng tin v? b?nh ti?u ????ng [place]?, ha?y truy c?p:  American Diabetes Association (ADA) (Hi?p h?i Ti?u ????ng My?): www.diabetes.org  Centers for Disease Control and Prevention (CDC) (Trung tm Ki?m sot v Phng ng?a D?ch b?nh): [employer].com.brwww.cdc.gov Tm t?t  Ki?m tra l??ng ???ng huy?t m?i ngy trong qu tnh mang thai c?a qu v?. Th?c hi?n vi?c ny th??ng xuyn theo ch? d?n c?a chuyn gia  ch?m [place] s?c kh?e.  N?u chuyn gia ch?m [place] s?c kh?e ? k ??n insulin ho?c thu?c ?i?u tr? ti?u ???ng, hy dng cc thu?c ny hng ngy theo ch? d?n.  Tun thu? t?t c? ca?c l?n khm theo di trong su?t [place]? (tr???c khi sinh) va? sau khi sinh (sau sinh) theo chi? d?n cu?a chuyn gia ch?m so?c s??c kho?e. ?i?u ny c vai tr quan tr?ng.  Ki?m tra l???ng ???ng huy?t c?a quy? vi? sau khi sinh 4-12 tu?n. Thng tin ny khng nh?m m?c ?ch thay th? cho l?i khuyn m chuyn gia ch?m [place] s?c kh?e ni v?i qu v?. Hy b?o ??m qu v? ph?i th?o lu?n b?t k? v?n ?? g m qu v? c v?i chuyn gia ch?m [place] s?c kh?e c?a qu v?. Document Released: 03/23/2017 Document Revised: 07/19/2018 Document Reviewed: 07/19/2018 Elsevier Patient Education  2020 [employer]

## 2019-08-12 ENCOUNTER — Telehealth: Payer: Self-pay | Admitting: Family Medicine

## 2019-08-12 NOTE — Telephone Encounter (Signed)
Patient was called with Interpreter [phone] a voice message was left about her appointment.

## 2019-08-15 ENCOUNTER — Ambulatory Visit (INDEPENDENT_AMBULATORY_CARE_PROVIDER_SITE_OTHER): Payer: Self-pay | Admitting: Family Medicine

## 2019-08-15 ENCOUNTER — Ambulatory Visit (INDEPENDENT_AMBULATORY_CARE_PROVIDER_SITE_OTHER): Payer: Self-pay | Admitting: *Deleted

## 2019-08-15 ENCOUNTER — Encounter: Payer: Self-pay | Admitting: General Practice

## 2019-08-15 ENCOUNTER — Other Ambulatory Visit: Payer: Self-pay

## 2019-08-15 ENCOUNTER — Ambulatory Visit: Payer: Self-pay

## 2019-08-15 VITALS — BP 123/78 | HR 88 | Wt 234.0 lb

## 2019-08-15 DIAGNOSIS — O0990 Supervision of high risk pregnancy, unspecified, unspecified trimester: Secondary | ICD-10-CM

## 2019-08-15 DIAGNOSIS — O09529 Supervision of elderly multigravida, unspecified trimester: Secondary | ICD-10-CM

## 2019-08-15 DIAGNOSIS — O09523 Supervision of elderly multigravida, third trimester: Secondary | ICD-10-CM

## 2019-08-15 DIAGNOSIS — Z113 Encounter for screening for infections with a predominantly sexual mode of transmission: Secondary | ICD-10-CM

## 2019-08-15 DIAGNOSIS — O24414 Gestational diabetes mellitus in pregnancy, insulin controlled: Secondary | ICD-10-CM

## 2019-08-15 DIAGNOSIS — O403XX0 Polyhydramnios, third trimester, not applicable or unspecified: Secondary | ICD-10-CM

## 2019-08-15 DIAGNOSIS — Z3A36 36 weeks gestation of pregnancy: Secondary | ICD-10-CM

## 2019-08-15 DIAGNOSIS — O0993 Supervision of high risk pregnancy, unspecified, third trimester: Secondary | ICD-10-CM

## 2019-08-15 DIAGNOSIS — Z789 Other specified health status: Secondary | ICD-10-CM

## 2019-08-15 LAB — OB RESULTS CONSOLE GC/CHLAMYDIA: Gonorrhea: NEGATIVE

## 2019-08-15 LAB — OB RESULTS CONSOLE GBS: GBS: NEGATIVE

## 2019-08-15 NOTE — Progress Notes (Signed)
   PRENATAL VISIT NOTE  Subjective:  Brooke Reid is a 43 y.o. G2P1001 at [redacted]w[redacted]d being seen today for ongoing prenatal care.  She is currently monitored for the following issues for this high-risk pregnancy and has Supervision of high risk pregnancy, antepartum; AMA (advanced maternal age) multigravida 35+, unspecified trimester; Language barrier; Gestational diabetes; Obesity in pregnancy; and Late prenatal care on their problem list.  Patient reports no complaints.  Contractions: Not present. Vag. Bleeding: None.  Movement: Present. Denies leaking of fluid.   The following portions of the patient's history were reviewed and updated as appropriate: allergies, current medications, past family history, past medical history, past social history, past surgical history and problem list.   Objective:   Vitals:   08/15/19 0829  BP: 123/78  Pulse: 88  Weight: 234 lb (106.1 kg)    Fetal Status: Fetal Heart Rate (bpm): 137  Fundal Height: 41 cm Movement: Present  Presentation: Vertex  General:  Alert, oriented and cooperative. Patient is in no acute distress.  Skin: Skin is warm and dry. No rash noted.   Cardiovascular: Normal heart rate noted  Respiratory: Normal respiratory effort, no problems with respiration noted  Abdomen: Soft, gravid, appropriate for gestational age.  Pain/Pressure: Present     Pelvic: Cervical exam performed Dilation: 2.5 Effacement (%): 50 Station: -3  Extremities: Normal range of motion.  Edema: Trace  Mental Status: Normal mood and affect. Normal behavior. Normal judgment and thought content.   Assessment and Plan:  Pregnancy: G2P1001 at [redacted]w[redacted]d 1. Supervision of high risk pregnancy, antepartum Cultures today - Culture, beta strep (group b only) - GC/Chlamydia probe amp ([place])not at [facility]  2. Insulin controlled gestational diabetes mellitus (GDM) in third trimester NST:  Baseline: 135 bpm, Variability: Good {> 6 bpm) and Accelerations: Reactive no decels FBS  70-88 2 hour pp 100-129 (3 of 21 out of range) Much improved after insulin dose change. 5 lb 10 oz at 34 4/7 wks  3. AMA (advanced maternal age) multigravida 76+, unspecified trimester Low risk NIPT  4. Language barrier  [place] interpreter: in person used   Preterm labor symptoms and general obstetric precautions including but not limited to vaginal bleeding, contractions, leaking of fluid and fetal movement were reviewed in detail with the patient. Please refer to After Visit Summary for other counseling recommendations.   Return in about 1 week (around 08/22/2019) for [facility], OB visit and BPP.  Future Appointments  Date Time Provider [place]  08/15/2019  9:15 AM [facility]OC-[facility]A NST [facility] WOC  08/22/2019  8:15 AM Aletha Halim, MD [facility]OC-[facility]A WOC  08/22/2019  9:15 AM WOC-WOCA NST WOC-[facility]OCA WOC    Donnamae Jude, MD

## 2019-08-15 NOTE — Patient Instructions (Signed)

## 2019-08-15 NOTE — Progress Notes (Signed)
Interpreter tina present for encounter.  Pt informed that the ultrasound is considered a limited OB ultrasound and is not intended to be a complete ultrasound exam.  Patient also informed that the ultrasound is not being completed with the intent of assessing for fetal or placental anomalies or any pelvic abnormalities.  Explained that the purpose of today's ultrasound is to assess for presentation, BPP and amniotic fluid volume.  Patient acknowledges the purpose of the exam and the limitations of the study.

## 2019-08-16 LAB — GC/CHLAMYDIA PROBE AMP (~~LOC~~) NOT AT ARMC
Chlamydia: NEGATIVE
Neisseria Gonorrhea: NEGATIVE

## 2019-08-19 ENCOUNTER — Telehealth: Payer: Self-pay | Admitting: Obstetrics and Gynecology

## 2019-08-19 LAB — CULTURE, BETA STREP (GROUP B ONLY): Strep Gp B Culture: NEGATIVE

## 2019-08-19 NOTE — Telephone Encounter (Signed)
Attempted to call patient w/ [place] interpreter ID# [phone] about her appointment on 8/31 @ 8:15. No answer left voicemail instructing patient to wear a face mask for the entire appointment and no visitors are allowed during the visit. Patient instructed not to attend the appointment if she was any symptoms. Symptom list and office number left.

## 2019-08-22 ENCOUNTER — Other Ambulatory Visit: Payer: Self-pay

## 2019-08-22 ENCOUNTER — Ambulatory Visit (INDEPENDENT_AMBULATORY_CARE_PROVIDER_SITE_OTHER): Payer: Self-pay | Admitting: Obstetrics and Gynecology

## 2019-08-22 ENCOUNTER — Telehealth (HOSPITAL_COMMUNITY): Payer: Self-pay | Admitting: *Deleted

## 2019-08-22 ENCOUNTER — Ambulatory Visit (INDEPENDENT_AMBULATORY_CARE_PROVIDER_SITE_OTHER): Payer: Self-pay | Admitting: *Deleted

## 2019-08-22 ENCOUNTER — Ambulatory Visit: Payer: Self-pay

## 2019-08-22 VITALS — BP 109/72 | HR 95 | Temp 98.4°F | Wt 232.3 lb

## 2019-08-22 DIAGNOSIS — O403XX0 Polyhydramnios, third trimester, not applicable or unspecified: Secondary | ICD-10-CM

## 2019-08-22 DIAGNOSIS — O09529 Supervision of elderly multigravida, unspecified trimester: Secondary | ICD-10-CM

## 2019-08-22 DIAGNOSIS — O09523 Supervision of elderly multigravida, third trimester: Secondary | ICD-10-CM

## 2019-08-22 DIAGNOSIS — O0993 Supervision of high risk pregnancy, unspecified, third trimester: Secondary | ICD-10-CM

## 2019-08-22 DIAGNOSIS — O24414 Gestational diabetes mellitus in pregnancy, insulin controlled: Secondary | ICD-10-CM

## 2019-08-22 DIAGNOSIS — Z789 Other specified health status: Secondary | ICD-10-CM

## 2019-08-22 DIAGNOSIS — O99210 Obesity complicating pregnancy, unspecified trimester: Secondary | ICD-10-CM

## 2019-08-22 DIAGNOSIS — O0990 Supervision of high risk pregnancy, unspecified, unspecified trimester: Secondary | ICD-10-CM

## 2019-08-22 DIAGNOSIS — Z3A37 37 weeks gestation of pregnancy: Secondary | ICD-10-CM

## 2019-08-22 DIAGNOSIS — O99213 Obesity complicating pregnancy, third trimester: Secondary | ICD-10-CM

## 2019-08-22 LAB — POCT URINALYSIS DIP (DEVICE)
Bilirubin Urine: NEGATIVE
Glucose, UA: NEGATIVE mg/dL
Hgb urine dipstick: NEGATIVE
Ketones, ur: NEGATIVE mg/dL
Nitrite: NEGATIVE
Protein, ur: NEGATIVE mg/dL
Specific Gravity, Urine: 1.015 (ref 1.005–1.030)
Urobilinogen, UA: 1 mg/dL (ref 0.0–1.0)
pH: 6.5 (ref 5.0–8.0)

## 2019-08-22 NOTE — Progress Notes (Signed)
Prenatal Visit Note Date: 08/22/2019 Clinic: [facility]  Subjective:  Brooke Reid is a 43 y.o. G2P1001 at [redacted]w[redacted]d being seen today for ongoing prenatal care.  She is currently monitored for the following issues for this high-risk pregnancy and has Supervision of high risk pregnancy, antepartum; AMA (advanced maternal age) multigravida 35+, unspecified trimester; Language barrier; Gestational diabetes; Obesity in pregnancy; Late prenatal care; and Polyhydramnios in third trimester on their problem list.  Patient reports no complaints.   Contractions: Irritability. Vag. Bleeding: None.   . Denies leaking of fluid.   The following portions of the patient's history were reviewed and updated as appropriate: allergies, current medications, past family history, past medical history, past social history, past surgical history and problem list. Problem list updated.  Objective:   Vitals:   08/22/19 0819  BP: 109/72  Pulse: 95  Temp: 98.4 F (36.9 C)  Weight: 232 lb 4.8 oz (105.4 kg)    Fetal Status: Fetal Heart Rate (bpm): 143         General:  Alert, oriented and cooperative. Patient is in no acute distress.  Skin: Skin is warm and dry. No rash noted.   Cardiovascular: Normal heart rate noted  Respiratory: Normal respiratory effort, no problems with respiration noted  Abdomen: Soft, gravid, appropriate for gestational age. Pain/Pressure: Present     Pelvic:  Cervical exam deferred        Extremities: Normal range of motion.  Edema: Trace  Mental Status: Normal mood and affect. Normal behavior. Normal judgment and thought content.   Urinalysis:      Assessment and Plan:  Pregnancy: G2P1001 at [redacted]w[redacted]d  1. Polyhydramnios in third trimester complication, single or unspecified fetus Has had mild poly over the past two u/s. D/w Dr. Donalee Citrin, and given she's ama, obese, with GDMA2, if still mild today and bpp 10/10, then recommend 38wk IOL. If normal today, then one week rpt  hrob and bpp and set up for 39wk IOL. Pt amenable to plan-->bpp 8/10 (-2 breathing) with normal AFI. Will set up for 39wk IOL -8/13 @ [place] u/s: 56% efw 2600gm, AC normal, mild poly  2. Obesity in pregnancy stable  3. Supervision of high risk pregnancy, antepartum See above  4. AMA (advanced maternal age) multigravida 76+, unspecified trimester See above.   5. Insulin controlled gestational diabetes mellitus (GDM) in third trimester Normal #s on NPH 20/20 and aspart 15 tidac  6. Language barrier Interpreter used  Term labor symptoms and general obstetric precautions including but not limited to vaginal bleeding, contractions, leaking of fluid and fetal movement were reviewed in detail with the patient. Please refer to After Visit Summary for other counseling recommendations.  RTC: 1wk hrob, nst/bpp   Aletha Halim, MD

## 2019-08-22 NOTE — Telephone Encounter (Signed)
Preadmission screen [phone] interpreter number

## 2019-08-22 NOTE — Progress Notes (Signed)
Live interpreter present for encounter.  Pt informed that the ultrasound is considered a limited OB ultrasound and is not intended to be a complete ultrasound exam.  Patient also informed that the ultrasound is not being completed with the intent of assessing for fetal or placental anomalies or any pelvic abnormalities.  Explained that the purpose of today's ultrasound is to assess for presentation, BPP and amniotic fluid volume.  Patient acknowledges the purpose of the exam and the limitations of the study.

## 2019-08-22 NOTE — Progress Notes (Signed)
Pt records Glucose readings on Paper Log

## 2019-08-23 ENCOUNTER — Telehealth (HOSPITAL_COMMUNITY): Payer: Self-pay | Admitting: *Deleted

## 2019-08-23 NOTE — Telephone Encounter (Signed)
Preadmission screen Interpreter number [phone]

## 2019-08-24 ENCOUNTER — Telehealth (HOSPITAL_COMMUNITY): Payer: Self-pay | Admitting: *Deleted

## 2019-08-24 NOTE — Telephone Encounter (Signed)
Preadmission screen Interpreter number [phone]

## 2019-08-25 ENCOUNTER — Encounter: Payer: Self-pay | Admitting: *Deleted

## 2019-08-25 ENCOUNTER — Telehealth (HOSPITAL_COMMUNITY): Payer: Self-pay | Admitting: *Deleted

## 2019-08-25 NOTE — Telephone Encounter (Signed)
[phone] interpreter number

## 2019-08-26 ENCOUNTER — Telehealth (HOSPITAL_COMMUNITY): Payer: Self-pay | Admitting: *Deleted

## 2019-08-26 NOTE — Telephone Encounter (Signed)
Preadmission screen Interpreter number [phone]

## 2019-08-29 ENCOUNTER — Other Ambulatory Visit: Payer: Self-pay | Admitting: Advanced Practice Midwife

## 2019-08-29 ENCOUNTER — Other Ambulatory Visit (HOSPITAL_COMMUNITY): Payer: Self-pay | Admitting: Advanced Practice Midwife

## 2019-08-30 ENCOUNTER — Other Ambulatory Visit: Payer: Self-pay | Admitting: Women's Health

## 2019-08-30 ENCOUNTER — Telehealth (HOSPITAL_COMMUNITY): Payer: Self-pay | Admitting: *Deleted

## 2019-08-30 NOTE — Telephone Encounter (Signed)
Preadmission screen Interpreter number [phone]

## 2019-08-31 ENCOUNTER — Other Ambulatory Visit: Payer: Self-pay

## 2019-08-31 ENCOUNTER — Inpatient Hospital Stay (HOSPITAL_COMMUNITY)
Admission: AD | Admit: 2019-08-31 | Discharge: 2019-09-01 | Disposition: A | Discharge status: Home / Self Care | Payer: Medicaid Other | Attending: Obstetrics & Gynecology | Admitting: Obstetrics & Gynecology

## 2019-08-31 ENCOUNTER — Telehealth: Payer: Self-pay | Admitting: Obstetrics & Gynecology

## 2019-08-31 ENCOUNTER — Telehealth (HOSPITAL_COMMUNITY): Payer: Self-pay | Admitting: *Deleted

## 2019-08-31 ENCOUNTER — Encounter (HOSPITAL_COMMUNITY): Payer: Self-pay

## 2019-08-31 DIAGNOSIS — O471 False labor at or after 37 completed weeks of gestation: Secondary | ICD-10-CM | POA: Insufficient documentation

## 2019-08-31 DIAGNOSIS — Z3689 Encounter for other specified antenatal screening: Secondary | ICD-10-CM

## 2019-08-31 DIAGNOSIS — Z3A38 38 weeks gestation of pregnancy: Secondary | ICD-10-CM | POA: Diagnosis not present

## 2019-08-31 DIAGNOSIS — O479 False labor, unspecified: Secondary | ICD-10-CM

## 2019-08-31 NOTE — Telephone Encounter (Signed)
Preadmission screen Interpreter number [phone]

## 2019-08-31 NOTE — MAU Provider Note (Signed)
S: Ms. Aurilla Coulibaly is a 43 y.o. G2P1001 at [redacted]w[redacted]d  who presents to MAU today for labor evaluation.     Cervical exam by RN:  Dilation: 1.5 Effacement (%): 50 Station: -3 Exam by:: Alycia Rossetti, RN  Fetal Monitoring: Baseline: 135 Variability: moderate Accelerations: present  Decelerations: none  Contractions: irregular   MDM Discussed patient with RN. NST reviewed.   A: SIUP at [redacted]w[redacted]d  False labor  P: Discharge home Labor precautions and kick counts included in AVS Patient to follow-up with WOC as scheduled tomorrow Patient may return to MAU as needed or when in labor   Lajean Manes, [place] 08/31/2019 11:40 PM

## 2019-08-31 NOTE — MAU Note (Signed)
Pt states she is feeling the baby move since being in MAU.

## 2019-08-31 NOTE — Discharge Instructions (Signed)
C?n co th?t Braxton Hicks Braxton Hicks Contractions Cc c?n co th?t t? cung c th? x?y ra trong su?t [place]?, nh?ng khng ph?i lc no c?ng l d?u hi?u cho th?y qu v? ?ang chuy?n d?. Qu v? c th? c cc c?n co th?t th?c t?p ???c g?i l c?n co th?t CSX CorporationBraxton Hicks. Nh?ng c?n co th?t chuy?n d? gi? ny ?i khi b? nh?m v?i chuy?n d? th?t. C?n co th?t Braxton Hicks l g? C?n co th?t Braxton Hicks l nh?ng chuy?n ??ng co th?t x?y ra trong c? t? cung tr??c khi chuy?n d?. Khng gi?ng cc c?n co th?t chuy?n d? th?t, nh?ng c?n co th?t ny khng lm m? (gin n?) v lm m?ng c? t? cung. V? cu?i [place] k? (32-34 tu?n), cc c?n co th?t Braxton Hicks c th? x?y ra th??ng xuyn h?n v c th? tr? ln m?nh h?n. ?i khi, nh?ng c?n co th?t ny kh phn bi?t v?i chuy?n d? th?t b?i v n c th? r?t kh ch?u. Qu v? ??ng c?m th?y ng??ng ngng n?u qu v? vo b?nh vi?n khi c chuy?n d? gi?. ?i khi, cch duy nh?t ?? bi?t qu v? c chuy?n d? th?t hay khng l ph?i ?? chuyn gia ch?m [place] s?c kh?e tm ki?m nh?ng thay ??i ? c? t? cung. Chuyn gia ch?m [place] s?c kh?e s? khm th?c th? v c th? theo di cc c?n co th?t c?a qu v?. N?u qu v? khng ph?i ?ang chuy?n d? th?t, vi?c th?m khm s? cho th?y c? t? cung c?a qu v? ?ang khng gin n? v ?i ch?a v?. N?u khng c v?n ?? s?c kh?e no khc lin quan ??n [place]?, ?? qu v? ra v? trong tr??ng h?p chuy?n d? gi? l hon ton an ton Reynolds Americancho qu v?. Qu v? c th? ti?p t?c c cc c?n co th?t Braxton Hicks cho ??n khi qu v? chuy?n d? th?t. Cch phn bi?t gi?a chuy?n d? th?t v chuy?n d? gi? Chuy?n d? th?t  Cc c?n co th?t ko di 30-70 giy.  Cc c?n co th?t tr? ln r?t ??u ??n.  C?m gic kh ch?u th??ng c?m th?y ? ??nh t? cung v lan t?i vng b?ng d??i v vng th?t l?ng.  Cc c?n co th?t khng h?t khi qu v? ?i l?i.  Cc c?n co th?t th??ng tr? ln m?nh h?n v t?ng t?n su?t.  C? t? cung gin n? v m?ng h?n. Chuy?n d? gi?  Cc c?n co th?t th??ng ng?n h?n v khng m?nh nh? c?n co  th?t chuy?n d? th?t.  Cc c?n co th?t th??ng khng ??u.  Cc c?n co th?t th??ng ???c c?m nh?n ? ph?n tr??c b?ng d??i v ? b?n.  Cc c?n co th?t c th? h?t khi qu v? ?i l?i ho?c thay ??i t? th? khi n?m.  Cc c?n co th?t tr? ln y?u h?n v ng?n h?n theo th?i gian.  C? t? cung th??ng khng gin ra ho?c m?ng ?i. Tun th? nh?ng h??ng d?n ny ? nh:   Ch? s? d?ng thu?c khng k ??n v thu?c k ??n theo ch? d?n c?a chuyn gia ch?m [place] s?c kh?e.  Ti?p t?c cc bi th? d?c thng th??ng c?a qu v? v lm theo cc ch? d?n khc c?a chuyn gia ch?m [place] s?c kh?e.  ?n v u?ng ?? nh? n?u qu v? ngh? qu v? s?p chuy?n d?.  N?u cc c?n co th?t Braxton Hicks lm qu v? kh ch?u: ?  Thay ??i t? t? th? n?m ho?c ngh? ng?i sang ?i l?i, ho?c t? ?i l?i sang ngh? ng?i. ? Ng?i v ngh? ng?i trong m?t b?n n??c ?m. ? U?ng ?? n??c ?? gi? cho n??c ti?u c mu vng nh?t. M?t n??c c th? gy ra nh?ng c?n co th?t nh? v?y. ? Th? ch?m v su vi l?n trong m?t ti?ng.  Tun th? t?t c? cc l?n khm theo di tr??c khi sinh theo ch? d?n c?a chuyn gia ch?m [place] s?c kh?e. ?i?u ny c vai tr quan tr?ng. Hy lin l?c v?i chuyn gia ch?m [place] s?c kh?e n?u:  Qu v? b? s?t.  Qu v? ti?p t?c b? ?au ? b?ng. Yu c?u tr? gip ngay l?p t?c n?u:  Cc c?n co th?t tr? nn m?nh h?n, ??u h?n v g?n nhau h?n.  Qu v? b? r? d?ch ho?c ti?t d?ch ? m ??o.  Qu v? ra d?ch nh?y c l?n mu (nh?t mu).  Qu v? b? ch?y mu ? m ??o.  Qu v? b? ?au vng th?t l?ng m tr??c ?y ch?a t?ng b? bao gi?Sander Nephew v? c?m th?y ??u c?a con qu v? ??y xu?ng d??i v gy c?m gic ? n?ng ln vng khung ch?u.  Con qu v? khng c? ??ng bn trong qu v? nhi?u nh? tr??c. Tm t?t  C?n co th?t x?y ra tr??c khi chuy?n d? ???c g?i l c?n co th?t Braxton Hicks, chuy?n d? gi?, ho?c cc c?n co th?t th?c t?p.  Cc c?n co th?t Braxton Hicks th??ng ng?n h?n, y?u h?n, cch xa h?n v t th??ng xuyn h?n cc c?n co th?t chuy?n d? th?t. Cc c?n co th?t chuy?n d? th?t  th??ng tr? ln m?nh d?n ln v ??u ??n, ??ng th?i tr? ln th??ng xuyn h?n.  X? tr c?m gic kh ch?u do cc c?n co th?t Braxton Hicks gy ra b?ng cch thay ??i t? th?, ngh? ng?i trong b?n t?m n??c ?m, u?ng nhi?u n??c, ho?c t?p th? su. Thng tin ny khng nh?m m?c ?ch thay th? cho l?i khuyn m chuyn gia ch?m [place] s?c kh?e ni v?i qu v?. Hy b?o ??m qu v? ph?i th?o lu?n b?t k? v?n ?? g m qu v? c v?i chuyn gia ch?m [place] s?c kh?e c?a qu v?. Document Released: 05/28/2017 Document Revised: 03/19/2018 Document Reviewed: 05/28/2017 Elsevier Patient Education  2020 [employer].  Reasons to return to MAU:  1.  Contractions are  5 minutes apart or less, each last 1 minute, these have been going on for 1-2 hours, and you cannot walk or talk during them 2.  You have a large gush of fluid, or a trickle of fluid that will not stop and you have to wear a pad 3.  You have bleeding that is bright red, heavier than spotting--like menstrual bleeding (spotting can be normal in early labor or after a check of your cervix) 4.  You do not feel the baby moving like he/she normally does

## 2019-08-31 NOTE — MAU Note (Signed)
Pt reports to MAU c/o ctx every 10 min. Pt reports some vaginal spotting tonight that is bright red but it is not a lot.DFM since the bleeding. No LOF.   InterperterMacon Large [phone]

## 2019-08-31 NOTE — Telephone Encounter (Signed)
Called the patient to confirm the upcoming appointment. Left a detailed voicemail of wearing a face mask, no children or visitors due to covid19 restrictions. Also instructed if the patient has been in contact with someone who has had covid19, if she has been diagnosed with covid19, or experienced any flu-like symptoms we ask that you please call our office to reschedule. °

## 2019-09-01 ENCOUNTER — Ambulatory Visit (INDEPENDENT_AMBULATORY_CARE_PROVIDER_SITE_OTHER): Payer: Medicaid Other | Admitting: *Deleted

## 2019-09-01 ENCOUNTER — Ambulatory Visit (INDEPENDENT_AMBULATORY_CARE_PROVIDER_SITE_OTHER): Payer: Self-pay | Admitting: Obstetrics and Gynecology

## 2019-09-01 ENCOUNTER — Ambulatory Visit: Payer: Self-pay

## 2019-09-01 ENCOUNTER — Inpatient Hospital Stay (HOSPITAL_COMMUNITY)
Admission: AD | Admit: 2019-09-01 | Discharge: 2019-09-04 | DRG: 807 | Disposition: A | Discharge status: Home / Self Care | Payer: Medicaid Other | Attending: Obstetrics and Gynecology | Admitting: Obstetrics and Gynecology

## 2019-09-01 ENCOUNTER — Encounter (HOSPITAL_COMMUNITY): Payer: Self-pay | Admitting: *Deleted

## 2019-09-01 ENCOUNTER — Other Ambulatory Visit (HOSPITAL_COMMUNITY)
Admission: RE | Admit: 2019-09-01 | Discharge: 2019-09-01 | Disposition: A | Discharge status: Home / Self Care | Payer: Self-pay | Source: Ambulatory Visit

## 2019-09-01 VITALS — BP 112/85 | HR 101 | Wt 237.2 lb

## 2019-09-01 DIAGNOSIS — Z3A38 38 weeks gestation of pregnancy: Secondary | ICD-10-CM | POA: Diagnosis not present

## 2019-09-01 DIAGNOSIS — O24419 Gestational diabetes mellitus in pregnancy, unspecified control: Secondary | ICD-10-CM

## 2019-09-01 DIAGNOSIS — Z3009 Encounter for other general counseling and advice on contraception: Secondary | ICD-10-CM

## 2019-09-01 DIAGNOSIS — O24424 Gestational diabetes mellitus in childbirth, insulin controlled: Secondary | ICD-10-CM | POA: Diagnosis present

## 2019-09-01 DIAGNOSIS — Z20828 Contact with and (suspected) exposure to other viral communicable diseases: Secondary | ICD-10-CM | POA: Diagnosis present

## 2019-09-01 DIAGNOSIS — O09529 Supervision of elderly multigravida, unspecified trimester: Secondary | ICD-10-CM

## 2019-09-01 DIAGNOSIS — O0993 Supervision of high risk pregnancy, unspecified, third trimester: Secondary | ICD-10-CM

## 2019-09-01 DIAGNOSIS — O403XX0 Polyhydramnios, third trimester, not applicable or unspecified: Principal | ICD-10-CM | POA: Diagnosis present

## 2019-09-01 DIAGNOSIS — O0930 Supervision of pregnancy with insufficient antenatal care, unspecified trimester: Secondary | ICD-10-CM

## 2019-09-01 DIAGNOSIS — Z23 Encounter for immunization: Secondary | ICD-10-CM

## 2019-09-01 DIAGNOSIS — Z758 Other problems related to medical facilities and other health care: Secondary | ICD-10-CM

## 2019-09-01 DIAGNOSIS — O24414 Gestational diabetes mellitus in pregnancy, insulin controlled: Secondary | ICD-10-CM

## 2019-09-01 DIAGNOSIS — O99210 Obesity complicating pregnancy, unspecified trimester: Secondary | ICD-10-CM

## 2019-09-01 DIAGNOSIS — O0990 Supervision of high risk pregnancy, unspecified, unspecified trimester: Secondary | ICD-10-CM

## 2019-09-01 DIAGNOSIS — Z789 Other specified health status: Secondary | ICD-10-CM

## 2019-09-01 DIAGNOSIS — O134 Gestational [pregnancy-induced] hypertension without significant proteinuria, complicating childbirth: Secondary | ICD-10-CM | POA: Diagnosis present

## 2019-09-01 DIAGNOSIS — O139 Gestational [pregnancy-induced] hypertension without significant proteinuria, unspecified trimester: Secondary | ICD-10-CM

## 2019-09-01 DIAGNOSIS — O09523 Supervision of elderly multigravida, third trimester: Secondary | ICD-10-CM

## 2019-09-01 DIAGNOSIS — O99213 Obesity complicating pregnancy, third trimester: Secondary | ICD-10-CM

## 2019-09-01 DIAGNOSIS — O322XX0 Maternal care for transverse and oblique lie, not applicable or unspecified: Secondary | ICD-10-CM | POA: Diagnosis present

## 2019-09-01 LAB — CBC
HCT: 43.2 % (ref 36.0–46.0)
Hemoglobin: 14.3 g/dL (ref 12.0–15.0)
MCH: 28.4 pg (ref 26.0–34.0)
MCHC: 33.1 g/dL (ref 30.0–36.0)
MCV: 85.9 fL (ref 80.0–100.0)
Platelets: 243 10*3/uL (ref 150–400)
RBC: 5.03 MIL/uL (ref 3.87–5.11)
RDW: 14.8 % (ref 11.5–15.5)
WBC: 7.3 10*3/uL (ref 4.0–10.5)
nRBC: 0 % (ref 0.0–0.2)

## 2019-09-01 LAB — TYPE AND SCREEN
ABO/RH(D): A POS
Antibody Screen: NEGATIVE

## 2019-09-01 LAB — SARS CORONAVIRUS 2 BY RT PCR (HOSPITAL ORDER, PERFORMED IN ~~LOC~~ HOSPITAL LAB): SARS Coronavirus 2: NEGATIVE

## 2019-09-01 LAB — GLUCOSE, CAPILLARY
Glucose-Capillary: 159 mg/dL — ABNORMAL HIGH (ref 70–99)
Glucose-Capillary: 162 mg/dL — ABNORMAL HIGH (ref 70–99)
Glucose-Capillary: 183 mg/dL — ABNORMAL HIGH (ref 70–99)
Glucose-Capillary: 152 mg/dL — ABNORMAL HIGH (ref 70–99)

## 2019-09-01 MED ORDER — LACTATED RINGERS IV SOLN
500.0000 mL | INTRAVENOUS | Status: DC | PRN
  Administered 2019-09-02: 05:00:00 500 mL via INTRAVENOUS

## 2019-09-01 MED ORDER — OXYTOCIN 40 UNITS IN NORMAL SALINE INFUSION - SIMPLE MED
1.0000 m[IU]/min | INTRAVENOUS | Status: DC
  Administered 2019-09-01: 20:00:00 2 m[IU]/min via INTRAVENOUS

## 2019-09-01 MED ORDER — SOD CITRATE-CITRIC ACID 500-334 MG/5ML PO SOLN: 30.0000 mL | ORAL | Status: DC | PRN

## 2019-09-01 MED ORDER — MISOPROSTOL 25 MCG QUARTER TABLET: 25.0000 ug | ORAL_TABLET | ORAL | Status: DC | PRN

## 2019-09-01 MED ORDER — INSULIN ASPART 100 UNIT/ML ~~LOC~~ SOLN: 0.0000 [IU] | SUBCUTANEOUS | Status: DC

## 2019-09-01 MED ORDER — OXYCODONE-ACETAMINOPHEN 5-325 MG PO TABS: 1.0000 | ORAL_TABLET | ORAL | Status: DC | PRN

## 2019-09-01 MED ORDER — ZOLPIDEM TARTRATE 5 MG PO TABS: 5.0000 mg | ORAL_TABLET | Freq: Every evening | ORAL | Status: DC | PRN

## 2019-09-01 MED ORDER — FLEET ENEMA 7-19 GM/118ML RE ENEM: 1.0000 | ENEMA | RECTAL | Status: DC | PRN

## 2019-09-01 MED ORDER — INSULIN REGULAR(HUMAN) IN NACL 100-0.9 UT/100ML-% IV SOLN
INTRAVENOUS | Status: DC
  Administered 2019-09-01: 1 [IU]/h via INTRAVENOUS
  Filled 2019-09-01: qty 100

## 2019-09-01 MED ORDER — LACTATED RINGERS IV SOLN
INTRAVENOUS | Status: DC
  Administered 2019-09-01 – 2019-09-02 (×2): via INTRAVENOUS

## 2019-09-01 MED ORDER — ONDANSETRON HCL 4 MG/2ML IJ SOLN: 4.0000 mg | Freq: Four times a day (QID) | INTRAMUSCULAR | Status: DC | PRN

## 2019-09-01 MED ORDER — OXYCODONE-ACETAMINOPHEN 5-325 MG PO TABS: 2.0000 | ORAL_TABLET | ORAL | Status: DC | PRN

## 2019-09-01 MED ORDER — INSULIN REGULAR(HUMAN) IN NACL 100-0.9 UT/100ML-% IV SOLN: INTRAVENOUS | Status: DC

## 2019-09-01 MED ORDER — LIDOCAINE HCL (PF) 1 % IJ SOLN
30.0000 mL | INTRAMUSCULAR | Status: AC | PRN
  Administered 2019-09-02: 30 mL via SUBCUTANEOUS
  Filled 2019-09-01: qty 30

## 2019-09-01 MED ORDER — OXYTOCIN BOLUS FROM INFUSION
500.0000 mL | Freq: Once | INTRAVENOUS | Status: AC
  Administered 2019-09-02: 500 mL via INTRAVENOUS

## 2019-09-01 MED ORDER — INSULIN REGULAR BOLUS VIA INFUSION
0.0000 [IU] | Freq: Three times a day (TID) | INTRAVENOUS | Status: DC
  Filled 2019-09-01: qty 10

## 2019-09-01 MED ORDER — SODIUM CHLORIDE 0.9 % IV SOLN: INTRAVENOUS | Status: DC

## 2019-09-01 MED ORDER — DEXTROSE 50 % IV SOLN: 25.0000 mL | INTRAVENOUS | Status: DC | PRN

## 2019-09-01 MED ORDER — DEXTROSE-NACL 5-0.45 % IV SOLN
INTRAVENOUS | Status: DC
  Administered 2019-09-01: 21:00:00 via INTRAVENOUS

## 2019-09-01 MED ORDER — TERBUTALINE SULFATE 1 MG/ML IJ SOLN: 0.2500 mg | Freq: Once | INTRAMUSCULAR | Status: DC | PRN

## 2019-09-01 MED ORDER — OXYTOCIN 40 UNITS IN NORMAL SALINE INFUSION - SIMPLE MED
2.5000 [IU]/h | INTRAVENOUS | Status: DC
  Administered 2019-09-02: 2.5 [IU]/h via INTRAVENOUS
  Filled 2019-09-01: qty 1000

## 2019-09-01 MED ORDER — FENTANYL CITRATE (PF) 100 MCG/2ML IJ SOLN
50.0000 ug | INTRAMUSCULAR | Status: DC | PRN
  Administered 2019-09-02: 100 ug via INTRAVENOUS
  Administered 2019-09-02: 50 ug via INTRAVENOUS
  Administered 2019-09-02: 100 ug via INTRAVENOUS
  Filled 2019-09-01 (×3): qty 2

## 2019-09-01 MED ORDER — HYDROXYZINE HCL 50 MG PO TABS: 50.0000 mg | ORAL_TABLET | Freq: Four times a day (QID) | ORAL | Status: DC | PRN

## 2019-09-01 MED ORDER — INSULIN NPH (HUMAN) (ISOPHANE) 100 UNIT/ML ~~LOC~~ SUSP: 10.0000 [IU] | Freq: Two times a day (BID) | SUBCUTANEOUS | 0 refills | Status: DC

## 2019-09-01 MED ORDER — ACETAMINOPHEN 325 MG PO TABS: 650.0000 mg | ORAL_TABLET | ORAL | Status: DC | PRN

## 2019-09-01 NOTE — Progress Notes (Signed)
Admitted patient to [place] with assistance of [place] interpreter via Production manager.  Confirmation [id number]. Interpreter also assisted with MD/CNM discussion, assessment. Patient verbalized understanding of & in agreement with  plan of care.

## 2019-09-01 NOTE — H&P (Addendum)
Brooke Reid is a 43 y.o. female G2P1001 with IUP at 7275w4d presenting for IOL for third trimester polyhydramnios. [facility]  Patient was examined in the presence of vietnamese video interpreter.  Patient presents today for planned IOL due to third trimester polyhydramnios. She was seen in the MAU last night for a labor evaluation after noting contractions, evidence of vaginal mucus, and vaginal spotting. Since then, she has noticed continuation of contractions and positive fetal movement. She feels well and has no complaints. Her current plan is to breast feed following delivery.  Lastly, she would like outpatient circumcision for her baby boy.   She denies headaches, visual changes, chest pain, shortness of breath. Notes some lowering extremity swelling that progressed throughout the course of her pregnancy.   Prenatal History/Complications: SVD x1   -Advanced maternal age  -Gestational diabetes mellitus  -Late prenatal care -Third trimester polyhydramnios   Past Medical History: Past Medical History:  Diagnosis Date  . Diabetes mellitus without complication (HCC) 07/2019   Gestational; Insulin-dependent  . Gestational diabetes 07/2019   Insulin-dependent    Past Surgical History: Past Surgical History:  Procedure Laterality Date  . NO PAST SURGERIES      Obstetrical History: OB History    Gravida  2   Para  1   Term  1   Preterm  0   AB  0   Living  1     SAB  0   TAB  0   Ectopic  0   Multiple  0   Live Births  1           Social History: Social History   Socioeconomic History  . Marital status: Married    Spouse name: Not on file  . Number of children: Not on file  . Years of education: Not on file  . Highest education level: Not on file  Occupational History  . Not on file  Social Needs  . Financial resource strain: Not on file  . Food insecurity    Worry: Never true     Inability: Never true  . Transportation needs    Medical: No    Non-medical: No  Tobacco Use  . Smoking status: Never Smoker  . Smokeless tobacco: Never Used  Substance and Sexual Activity  . Alcohol use: Never    Frequency: Never  . Drug use: Never  . Sexual activity: Not Currently    Birth control/protection: Condom  Lifestyle  . Physical activity    Days per week: Not on file    Minutes per session: Not on file  . Stress: Not on file  Relationships  . Social Musicianconnections    Talks on phone: Not on file    Gets together: Not on file    Attends religious service: Not on file    Active member of club or organization: Not on file    Attends meetings of clubs or organizations: Not on file    Relationship status: Not on file  Other Topics Concern  . Not on file  Social History Narrative  . Not on file    Family History: History reviewed. No pertinent family history.  Allergies: No Known Allergies  Medications Prior to Admission  Medication Sig Dispense Refill Last Dose  . insulin NPH Human (NOVOLIN N) 100 UNIT/ML injection Inject 0.1 mLs (10 Units total) into the skin 2 (two) times daily before a meal for 7  days. With breakfast and dinner 1.4 mL 0   . Insulin Syringes, Disposable, U-100 0.3 ML MISC Inject insulin twice per day 100 each 3   . Prenatal Vit-Fe Fumarate-FA (PRENATAL MULTIVITAMIN) TABS tablet Take 1 tablet by mouth daily at 12 noon.       Review of Systems   Constitutional: Negative for fever and chills Eyes: Negative for visual disturbances Respiratory: Negative for shortness of breath, dyspnea Cardiovascular: Negative for chest pain or palpitations  Gastrointestinal: Negative for abdominal pain, vomiting, diarrhea and constipation.   Genitourinary: Negative for dysuria and urgency Extremities: notes leg swelling throughout pregnancy Musculoskeletal: Negative for back pain, joint pain, myalgias  Neurological: Negative for dizziness and  headaches   Blood pressure 123/77, pulse 87, temperature 99 F (37.2 C), temperature source Oral, resp. rate 16, last menstrual period 12/05/2018.  General appearance: alert, cooperative, appears stated age and no distress Lungs: normal respiratory effort Heart: regular rate and rhythm. Normal s1, s2 Abdomen: soft, non-tender; bowel sounds normal Extremities: Homans sign is negative, no pitting edema, no sign of DVT DTR's 2+ Presentation: unsure Fetal monitoring  Baseline: 145 bpm, Variability: Good {> 6 bpm) and Accelerations: Reactive Uterine activity  Date/time of onset: 09/09 ~8p, Frequency: Every 4-5 minutes    Prenatal labs: ABO, Rh: A/Positive/-- (06/08 0919) Antibody: Negative (06/08 0919) Rubella: immune RPR: Non Reactive (07/01 0816)  HBsAg: Negative (06/08 0919)  HIV: Non Reactive (07/01 0816)  GBS: Negative/-- (08/24 0906)  1 hr Glucola 322 (07/01 0816) Genetic screening Negative (06/08) Anatomy US Normal (06/04)  Prenatal Transfer Tool  Maternal Diabetes: GDM class A2 controlled with insulin Genetic Screening: Normal Maternal Ultrasounds/Referrals: Normal Fetal Ultrasounds or other Referrals:  Referred to Materal Fetal Medicine  Maternal Substance Abuse:  No Significant Maternal Medications:  None Significant Maternal Lab Results: Group B Strep negative    Results for orders placed or performed during the hospital encounter of 09/01/19 (from the past 24 hour(s))  SARS Coronavirus 2 [facility] order, Performed in [place] hospital lab) Nasopharyngeal Nasopharyngeal Swab   Collection Time: 09/01/19  6:02 PM   Specimen: Nasopharyngeal Swab  Result Value Ref Range   SARS Coronavirus 2 NEGATIVE NEGATIVE    Assessment: Brooke Reid is a 43 y.o. G2P1001 with an IUP at [redacted]w[redacted]d presenting for IOL for third trimester polyhydramnios and GDM class A2.  Plan: #Polyhydramnios: Will induce with low-dose pitocin   #A2GDM controlled with insulin. Decreased to NPH 10/10  today, will monitor blood glucose q4 while laboring, glucose stabilizing plan initiated  #Pain:  PRN acetaminophen, oxy, fentanyl  #FWB Cat 1 #ID: GBS: negative  #MOF:  Breast feeding #MOC:wanted BTL, possibly OCP's #Circ: outpatient circumcision    Marijean Bravo Brinson 09/01/2019, 7:11 PM    I saw and performed the exam with the Medical student as above.   Arby Barrette Driver, MD   I confirm that I have verified the information documented in the resident's note and that I have also personally performed the history, physical exam and all medical decision making activities of this service and have verified that all service and findings are accurately documented in this student's note.   -Discussed how BTL not an option currently due to lack of consents -Cervical exam 4/50/-3, Vertex, Soft, Middle -Will start Glucostabilizer per protocol.  Gavin Pound, CNM 09/01/2019 8:09 PM

## 2019-09-01 NOTE — Progress Notes (Signed)
Interpreter [place] Flu vaccine today

## 2019-09-01 NOTE — Progress Notes (Signed)
Brooke Reid is a 43 y.o. G2P1001 at [redacted]w[redacted]d   Subjective: Denies questions or concerns. Resting comfortably in bed  Objective: BP 123/77   Pulse 87   Temp 99 F (37.2 C) (Oral)   Resp 16   Ht 5' 6.93" (1.7 m)   Wt 107.6 kg   LMP 12/05/2018   BMI 37.23 kg/m  No intake/output data recorded. No intake/output data recorded.  FHT:  FHR: 140 bpm, variability: moderate,  accelerations:  Present,  decelerations:  Absent UC:   irregular, every 2-6 minutes SVE:   Dilation: 4 Effacement (%): 50 Station: -3 Exam by:: Gavin Pound, CNM  Labs: Lab Results  Component Value Date   WBC 7.3 09/01/2019   HGB 14.3 09/01/2019   HCT 43.2 09/01/2019   MCV 85.9 09/01/2019   PLT 243 09/01/2019    Assessment / Plan: 43 y.o.  G2P1001 at [redacted]w[redacted]d  Category I tracing A2GDM on insulin, Glucose Stabilizer ordered by previous shift, started a moment ago Pitocin started at 2000. Titrate PRN Anticipate NSVD  Language barrier: [place] iPad interpreter utilized for all patient interaction   Darlina Rumpf, CNM 09/01/2019, 8:30 PM

## 2019-09-01 NOTE — Progress Notes (Signed)
Interpreter Altha Harm present for encounter. Pt reports decreased fetal movement since yesterday when UC's began. Pt had MAU visit last evening for R/O labor.   Pt informed that the ultrasound is considered a limited OB ultrasound and is not intended to be a complete ultrasound exam.  Patient also informed that the ultrasound is not being completed with the intent of assessing for fetal or placental anomalies or any pelvic abnormalities.  Explained that the purpose of today's ultrasound is to assess for presentation, BPP and amniotic fluid volume.  Patient acknowledges the purpose of the exam and the limitations of the study.    Results of BPP discussed w/Dr. Ilda Basset. He advised need for direct admit to [facility] for induction of labor today. Pt was informed of plan of care. She voiced understanding and agreed to plan and will proceed to [facility] upon leaving office. Dr. Elly Modena was notified of pt status by Dr. Ilda Basset. Nursing report called to [facility], RN @ [facility] by me.

## 2019-09-01 NOTE — Progress Notes (Signed)
Prenatal Visit Note Date: 09/01/2019 [facility]  Subjective:  Brooke Reid is a 43 y.o. G2P1001 at [redacted]w[redacted]d being seen today for ongoing prenatal care.  She is currently monitored for the following issues for this high-risk pregnancy and has Supervision of high risk pregnancy, antepartum; AMA (advanced maternal age) multigravida 35+, unspecified trimester; Language barrier; GDM, class A2; Obesity in pregnancy; Late prenatal care; and Polyhydramnios in third trimester on their problem list.  Patient reports ran out of insulin 3 days ago. .   Contractions: Irritability. Vag. Bleeding: None.  Movement: Present. Denies leaking of fluid.   The following portions of the patient's history were reviewed and updated as appropriate: allergies, current medications, past family history, past medical history, past social history, past surgical history and problem list. Problem list updated.  Objective:   Vitals:   09/01/19 1428  BP: 112/85  Pulse: (!) 101  Weight: 237 lb 3.2 oz (107.6 kg)    Fetal Status: Fetal Heart Rate (bpm): 150   Movement: Present     General:  Alert, oriented and cooperative. Patient is in no acute distress.  Skin: Skin is warm and dry. No rash noted.   Cardiovascular: Normal heart rate noted  Respiratory: Normal respiratory effort, no problems with respiration noted  Abdomen: Soft, gravid, appropriate for gestational age. Pain/Pressure: Present     Pelvic:  Cervical exam deferred        Extremities: Normal range of motion.  Edema: Trace  Mental Status: Normal mood and affect. Normal behavior. Normal judgment and thought content.   Urinalysis:      Assessment and Plan:  Pregnancy: G2P1001 at [redacted]w[redacted]d  1. GDM, class A2 See below  2. Supervision of high risk pregnancy, antepartum Routine care. GBS neg  3. AMA (advanced maternal age) multigravida 58+, unspecified trimester No current issues  4. Language barrier Interpreter used  5. Obesity  in pregnancy No current issues  6. Insulin controlled gestational diabetes mellitus (GDM) in third trimester CBGs prior to running out of her insulin (NPH 20/20 with breakfast and dinner and aspart 15 tidac) were good with am fastings in the 60s-90s and 2h PP in the 100s-110s and some 120s. For the past three days, am fastings in the 70s-90s and 2h PP with a few in the 130s. Given this. Recommend stopping aspart and dropping her nph to 10/10. Follow up nst/bpp for today. Already set up for iol on 9/13. -8/13 @ [facility] u/s: 56% efw 2600gm, AC normal, mild poly - insulin NPH Human (NOVOLIN N) 100 UNIT/ML injection; Inject 0.1 mLs (10 Units total) into the skin 2 (two) times daily before a meal for 7 days. With breakfast and dinner  Dispense: 1.4 mL; Refill: 0  Term labor symptoms and general obstetric precautions including but not limited to vaginal bleeding, contractions, leaking of fluid and fetal movement were reviewed in detail with the patient. Please refer to After Visit Summary for other counseling recommendations.  Return in about 1 month (around 10/01/2019) for in person pp visit. Aletha Halim, MD    ADDENDUM:  bpp done and nst pending. bpp 4/8 with +breathing and fluid with poly at 29, cephalic Pt just put on nst, category I but not reactive yet with one UC noted.  Given bpp findings, recommend going ahead with IOL today. Pt amenable to plan. L&D and Dr. Elly Modena aware  Durene Romans MD Attending [facility] [place] [facility] ([facility]ulty Practice) 09/01/2019 Time: [phone]

## 2019-09-02 ENCOUNTER — Other Ambulatory Visit (HOSPITAL_COMMUNITY)
Admission: RE | Admit: 2019-09-02 | Discharge: 2019-09-02 | Disposition: A | Discharge status: Home / Self Care | Payer: Self-pay | Source: Ambulatory Visit

## 2019-09-02 ENCOUNTER — Encounter (HOSPITAL_COMMUNITY): Payer: Self-pay

## 2019-09-02 DIAGNOSIS — O403XX0 Polyhydramnios, third trimester, not applicable or unspecified: Secondary | ICD-10-CM

## 2019-09-02 DIAGNOSIS — Z3A38 38 weeks gestation of pregnancy: Secondary | ICD-10-CM

## 2019-09-02 DIAGNOSIS — O24424 Gestational diabetes mellitus in childbirth, insulin controlled: Secondary | ICD-10-CM

## 2019-09-02 DIAGNOSIS — O139 Gestational [pregnancy-induced] hypertension without significant proteinuria, unspecified trimester: Secondary | ICD-10-CM

## 2019-09-02 DIAGNOSIS — O134 Gestational [pregnancy-induced] hypertension without significant proteinuria, complicating childbirth: Secondary | ICD-10-CM

## 2019-09-02 LAB — CBC
HCT: 42.9 % (ref 36.0–46.0)
Hemoglobin: 14.1 g/dL (ref 12.0–15.0)
MCH: 28.3 pg (ref 26.0–34.0)
MCHC: 32.9 g/dL (ref 30.0–36.0)
MCV: 86 fL (ref 80.0–100.0)
Platelets: 227 10*3/uL (ref 150–400)
RBC: 4.99 MIL/uL (ref 3.87–5.11)
RDW: 14.8 % (ref 11.5–15.5)
WBC: 11.2 10*3/uL — ABNORMAL HIGH (ref 4.0–10.5)
nRBC: 0 % (ref 0.0–0.2)

## 2019-09-02 LAB — GLUCOSE, CAPILLARY
Glucose-Capillary: 115 mg/dL — ABNORMAL HIGH (ref 70–99)
Glucose-Capillary: 130 mg/dL — ABNORMAL HIGH (ref 70–99)
Glucose-Capillary: 91 mg/dL (ref 70–99)
Glucose-Capillary: 93 mg/dL (ref 70–99)
Glucose-Capillary: 98 mg/dL (ref 70–99)

## 2019-09-02 LAB — COMPREHENSIVE METABOLIC PANEL
ALT: 15 U/L (ref 0–44)
AST: 16 U/L (ref 15–41)
Albumin: 2.7 g/dL — ABNORMAL LOW (ref 3.5–5.0)
Alkaline Phosphatase: 178 U/L — ABNORMAL HIGH (ref 38–126)
Anion gap: 7 (ref 5–15)
BUN: 9 mg/dL (ref 6–20)
CO2: 21 mmol/L — ABNORMAL LOW (ref 22–32)
Calcium: 8.3 mg/dL — ABNORMAL LOW (ref 8.9–10.3)
Chloride: 107 mmol/L (ref 98–111)
Creatinine, Ser: 0.38 mg/dL — ABNORMAL LOW (ref 0.44–1.00)
GFR calc Af Amer: >60 mL/min (ref >60)
GFR calc non Af Amer: >60 mL/min (ref >60)
Glucose, Bld: 104 mg/dL — ABNORMAL HIGH (ref 70–99)
Potassium: 3.5 mmol/L (ref 3.5–5.1)
Sodium: 135 mmol/L (ref 135–145)
Total Bilirubin: 0.5 mg/dL (ref 0.3–1.2)
Total Protein: 6.2 g/dL — ABNORMAL LOW (ref 6.5–8.1)

## 2019-09-02 LAB — RPR: RPR Ser Ql: NONREACTIVE

## 2019-09-02 LAB — ABO/RH: ABO/RH(D): A POS

## 2019-09-02 LAB — PROTEIN / CREATININE RATIO, URINE
Creatinine, Urine: 137.08 mg/dL
Protein Creatinine Ratio: 0.18 mg/mg{Cre} — ABNORMAL HIGH (ref 0.00–0.15)
Total Protein, Urine: 24 mg/dL

## 2019-09-02 MED ORDER — MEASLES, MUMPS & RUBELLA VAC IJ SOLR: 0.5000 mL | Freq: Once | INTRAMUSCULAR | Status: DC

## 2019-09-02 MED ORDER — MISOPROSTOL 200 MCG PO TABS: 800.0000 ug | ORAL_TABLET | Freq: Once | ORAL | Status: DC

## 2019-09-02 MED ORDER — SIMETHICONE 80 MG PO CHEW: 80.0000 mg | CHEWABLE_TABLET | ORAL | Status: DC | PRN

## 2019-09-02 MED ORDER — BENZOCAINE-MENTHOL 20-0.5 % EX AERO
1.0000 "application " | INHALATION_SPRAY | CUTANEOUS | Status: DC | PRN
  Administered 2019-09-02 – 2019-09-03 (×2): 1 via TOPICAL
  Filled 2019-09-02 (×2): qty 56

## 2019-09-02 MED ORDER — ONDANSETRON HCL 4 MG PO TABS: 4.0000 mg | ORAL_TABLET | ORAL | Status: DC | PRN

## 2019-09-02 MED ORDER — IBUPROFEN 600 MG PO TABS
600.0000 mg | ORAL_TABLET | Freq: Four times a day (QID) | ORAL | Status: DC
  Administered 2019-09-02 – 2019-09-04 (×8): 600 mg via ORAL
  Filled 2019-09-02 (×8): qty 1

## 2019-09-02 MED ORDER — DIBUCAINE (PERIANAL) 1 % EX OINT: 1.0000 "application " | TOPICAL_OINTMENT | CUTANEOUS | Status: DC | PRN

## 2019-09-02 MED ORDER — ONDANSETRON HCL 4 MG/2ML IJ SOLN: 4.0000 mg | INTRAMUSCULAR | Status: DC | PRN

## 2019-09-02 MED ORDER — TETANUS-DIPHTH-ACELL PERTUSSIS 5-2.5-18.5 LF-MCG/0.5 IM SUSP: 0.5000 mL | Freq: Once | INTRAMUSCULAR | Status: DC

## 2019-09-02 MED ORDER — PRENATAL MULTIVITAMIN CH
1.0000 | ORAL_TABLET | Freq: Every day | ORAL | Status: DC
  Administered 2019-09-02 – 2019-09-03 (×2): 1 via ORAL
  Filled 2019-09-02 (×2): qty 1

## 2019-09-02 MED ORDER — COCONUT OIL OIL: 1.0000 "application " | TOPICAL_OIL | Status: DC | PRN

## 2019-09-02 MED ORDER — ACETAMINOPHEN 325 MG PO TABS
650.0000 mg | ORAL_TABLET | ORAL | Status: DC | PRN
  Administered 2019-09-02: 650 mg via ORAL
  Filled 2019-09-02: qty 2

## 2019-09-02 MED ORDER — WITCH HAZEL-GLYCERIN EX PADS: 1.0000 "application " | MEDICATED_PAD | CUTANEOUS | Status: DC | PRN

## 2019-09-02 MED ORDER — IBUPROFEN 600 MG PO TABS: 600.0000 mg | ORAL_TABLET | Freq: Once | ORAL | Status: DC

## 2019-09-02 MED ORDER — FERROUS SULFATE 325 (65 FE) MG PO TABS
325.0000 mg | ORAL_TABLET | Freq: Two times a day (BID) | ORAL | Status: DC
  Administered 2019-09-02 – 2019-09-04 (×4): 325 mg via ORAL
  Filled 2019-09-02 (×4): qty 1

## 2019-09-02 MED ORDER — MAGNESIUM HYDROXIDE 400 MG/5ML PO SUSP
30.0000 mL | ORAL | Status: DC | PRN
  Administered 2019-09-03: 30 mL via ORAL
  Filled 2019-09-02: qty 30

## 2019-09-02 MED ORDER — DIPHENHYDRAMINE HCL 25 MG PO CAPS: 25.0000 mg | ORAL_CAPSULE | Freq: Four times a day (QID) | ORAL | Status: DC | PRN

## 2019-09-02 MED ORDER — MISOPROSTOL 200 MCG PO TABS
ORAL_TABLET | ORAL | Status: AC
  Administered 2019-09-02: 05:00:00 800 ug
  Filled 2019-09-02: qty 4

## 2019-09-02 NOTE — Progress Notes (Signed)
Brooke Reid is a 43 y.o. G2P1001 at [redacted]w[redacted]d   Subjective: Patient more demonstrably uncomfortable then previous exam. Rates contraction pain as 8/10. Declines pain medication, desires unmedicated birth. Coping very well with focused breathing and distraction.  Objective: BP 135/81   Pulse 89   Temp 98.1 F (36.7 C) (Oral)   Resp 19   Ht 5' 6.93" (1.7 m)   Wt 107.6 kg   LMP 12/05/2018   BMI 37.23 kg/m  No intake/output data recorded. Total I/O In: 1149.6 [P.O.:320; I.V.:829.6] Out: 450 [Urine:450]  FHT:  FHR: 135 bpm, variability: moderate,  accelerations:  Present,  decelerations:  Absent UC:   irregular, every 2-3 minutes SVE:   Dilation: 4 Effacement (%): 50 Station: -3 Exam by:: cwhite,rnc  Labs: Lab Results  Component Value Date   WBC 7.3 09/01/2019   HGB 14.3 09/01/2019   HCT 43.2 09/01/2019   MCV 85.9 09/01/2019   PLT 243 09/01/2019    Assessment / Plan: 43 y.o. G2P1001 at [redacted]w[redacted]d  Category I tracing GBS Negative A2GDM, continue hourly CBGs Pitocin started at 2000 hours, currently infusing at 14 milliunits. Continue to titrate PRN Next VE at 0315. Consider AROM if well engaged and effaced Anticipate NSVD  Darlina Rumpf, CNM 09/02/2019, 12:50 AM

## 2019-09-02 NOTE — Discharge Instructions (Signed)

## 2019-09-02 NOTE — Progress Notes (Signed)
Checked on patient via the [place] interpreter [phone], gave patient her motrin and iron.  Noted that infant has bruising on nose, cheeks, and on the upper lip. Explained this to Mom and answered all her questions. Ordered patients dinner and told her when to expect it. No further questions at this time.

## 2019-09-02 NOTE — Progress Notes (Signed)
Christie Viscomi is a 43 y.o. G2P1001 at [redacted]w[redacted]d   Subjective: Moaning through contractions. Coping very well with supportive partner at bedside.  Objective: BP 139/84   Pulse 88   Temp 98.3 F (36.8 C) (Oral)   Resp 20   Ht 5' 6.93" (1.7 m)   Wt 107.6 kg   LMP 12/05/2018   BMI 37.23 kg/m  No intake/output data recorded. Total I/O In: 1915.7 [P.O.:320; I.V.:1595.7] Out: 775 [Urine:775]  FHT:  FHR: 125 bpm, variability: moderate,  accelerations:  Present,  decelerations:  Absent UC:   regular, every 2 minutes SVE:   Dilation: 9 Effacement (%): 100 Station: 0 Exam by:: Jeronimo Greaves, CNM  Labs: Lab Results  Component Value Date   WBC 11.2 (H) 09/02/2019   HGB 14.1 09/02/2019   HCT 42.9 09/02/2019   MCV 86.0 09/02/2019   PLT 227 09/02/2019    Assessment / Plan: --Category I tracing --AROM 0400, large amount of clear fluid --Room ready for delivery --Anticipate NSVD shortly   Darlina Rumpf , CNM 09/02/2019, 4:16 AM

## 2019-09-02 NOTE — Progress Notes (Signed)
Using [employer] Interpreter got patient up to the restroom and went over peri care and assisted her with the pads and dermoplast spray.  Patient is having no pain and her lunch was ordered for her and is to arrive at 1300.

## 2019-09-02 NOTE — Progress Notes (Signed)
Brooke Reid is a 43 y.o. G2P1001 at [redacted]w[redacted]d   Subjective: Patient requesting pain medication due to increasing discomfort with contractions.  Objective: BP 138/89   Pulse 82   Temp 98.3 F (36.8 C) (Oral)   Resp 17   Ht 5' 6.93" (1.7 m)   Wt 107.6 kg   LMP 12/05/2018   BMI 37.23 kg/m  No intake/output data recorded. Total I/O In: 1652.7 [P.O.:320; I.V.:1332.7] Out: 450 [Urine:450]  FHT:  FHR: 135 bpm, variability: moderate,  accelerations:  Present,  decelerations:  Absent UC:   regular, every 2-4 minutes SVE:   Dilation: 5.5 Effacement (%): 60 Station: -2 Exam by:: cwhite,rnc  Labs: Lab Results  Component Value Date   WBC 7.3 09/01/2019   HGB 14.3 09/01/2019   HCT 43.2 09/01/2019   MCV 85.9 09/01/2019   PLT 243 09/01/2019    Assessment / Plan: --Tracing and vitals reviewed with RN --Patient now meets criteria for Gestational Hypertension diagnosis. No severe range BPs, no severe symptoms --Category I tracing --Now 5.5cm and fetal head well-applied per RN exam. Continue Pitocin titration PRN --Consider AROM with next exam --Anticipate NSVD  Darlina Rumpf, CNM 09/02/2019, 2:48 AM

## 2019-09-02 NOTE — Lactation Note (Signed)
This note was copied from a baby's chart. Lactation Consultation Note  Patient Name: Brooke Reid VEHMC'N Date: 09/02/2019 Reason for consult: Follow-up assessment;Early term 37-38.6wks;Other (Comment)([place] interpreter via [place] #[phone])  2nd [place] visit for this mom , ( earlier had visited mom and she was eating lunch had [place] had the [facility] interpreter Sandra Cockayne #[phone] tell her  To hold off on feeding the baby a bottle and LC would come back to assess the feeding at the breast. When LC came back for the 2nd feeding mom had fed the baby 11 ml of formula. Baby was awake and LC assisted mom  With pillows to latch and baby was not interested. LC also changed a black to mec stool .  LC reviewed the benefits of STS to increase the breast feeding hormones and encouraged mom to call for [facility] assist to assess the feeding.  Mom did make a comment that she has breast fed before and she knows how.  LC reassured her that she can latch , the staff just needs to assess the latch to make sure this baby is feeding well.  LC mentioned to mom every baby is different so its important to assess a feeding.  LC provided the [facility] pamphlet with phone number and mom mentioned when asked that she has family and friends  that can speak [place].     Maternal Data Has patient been taught Hand Expression?: (pe rmom i know how to hand express from my 1 st and declined a review) Does the patient have breastfeeding experience prior to this delivery?: Yes  Feeding Feeding Type: Breast Fed Nipple Type: Slow - flow  LATCH Score Latch: Too sleepy or reluctant, no latch achieved, no sucking elicited.  Audible Swallowing: None  Type of Nipple: Everted at rest and after stimulation  Comfort (Breast/Nipple): Soft / non-tender  Hold (Positioning): Assistance needed to correctly position infant at breast and maintain latch.  LATCH Score: 5  Interventions Interventions: Breast feeding basics reviewed;Assisted  with latch;Skin to skin;Adjust position;Support pillows  Lactation Tools Discussed/Used     Consult Status Consult Status: Follow-up Date: 09/03/19 Follow-up type: In-patient    [place] 09/02/2019, 3:51 PM

## 2019-09-02 NOTE — Discharge Summary (Addendum)
Postpartum Discharge Summary       Patient Name: Brooke Reid DOB: 06/09/1976 MRN: [id number]  Date of admission: 09/01/2019 Delivering Provider: Darlina Rumpf   Date of discharge: 09/03/2019  Admitting diagnosis: Labor Check  Intrauterine pregnancy: [redacted]w[redacted]d    Secondary diagnosis:  Principal Problem:   Encounter for induction of labor Active Problems:   AMA (advanced maternal age) multigravida 34+ unspecified trimester   Language barrier   GDM, class A2   Polyhydramnios in third trimester   Indication for care in labor or delivery   Gestational hypertension  Additional problems:      Discharge diagnosis: Term Pregnancy Delivered                                                                                                Post partum procedures:NA  Augmentation: AROM and Pitocin  Complications: None  Hospital course:  Induction of Labor With Vaginal Delivery   43y.o. yo G2P1001 at 41w5das admitted to the hospital 09/01/2019 for induction of labor.  Indication for induction: Gestational hypertension and A2 DM.  Patient had an uncomplicated labor course as follows: Membrane Rupture Time/Date: 4:07 AM ,09/02/2019   Intrapartum Procedures: Episiotomy: None [1]                                         Lacerations:  2nd degree [3];Perineal [11]  Patient had delivery of a Viable infant.  Information for the patient's newborn:  PhOple, Girgis0[[id number]]    09/02/2019  Details of delivery can be found in separate delivery note.  Patient had a routine postpartum course. Patient is discharged home 09/03/19. Delivery time: 5:16 AM    Magnesium Sulfate received: No BMZ received: No Rhophylac:No MMR:No Transfusion:No  Physical exam  Vitals:   09/02/19 1410 09/02/19 1740 09/02/19 2131 09/03/19 0555  BP: 138/88 127/84 135/84 116/76  Pulse: 78 84 79 71  Resp: '18 16 18 18  ' Temp: 98.3 F (36.8 C) 98.4 F (36.9 C) 99.2 F (37.3 C) 98.1 F (36.7 C)  TempSrc: Oral  Oral Oral Oral  SpO2:   99% 99%  Weight:      Height:       General: alert, cooperative and no distress Lochia: appropriate Uterine Fundus: firm Incision: N/A DVT Evaluation: No evidence of DVT seen on physical exam. No significant calf/ankle edema. Labs: Lab Results  Component Value Date   WBC 11.2 (H) 09/02/2019   HGB 14.1 09/02/2019   HCT 42.9 09/02/2019   MCV 86.0 09/02/2019   PLT 227 09/02/2019   CMP Latest Ref Rng & Units 09/02/2019  Glucose 70 - 99 mg/dL 104(H)  BUN 6 - 20 mg/dL 9  Creatinine 0.44 - 1.00 mg/dL 0.38(L)  Sodium 135 - 145 mmol/L 135  Potassium 3.5 - 5.1 mmol/L 3.5  Chloride 98 - 111 mmol/L 107  CO2 22 - 32 mmol/L 21(L)  Calcium 8.9 - 10.3 mg/dL 8.3(L)  Total Protein 6.5 - 8.1 g/dL 6.2(L)  Total Bilirubin 0.3 - 1.2 mg/dL 0.5  Alkaline Phos 38 - 126 U/L 178(H)  AST 15 - 41 U/L 16  ALT 0 - 44 U/L 15    Discharge instruction: per After Visit Summary and "Baby and Me Booklet".  After visit meds:  Allergies as of 09/03/2019   No Known Allergies     Medication List    STOP taking these medications   insulin NPH Human 100 UNIT/ML injection Commonly known as: NOVOLIN N   Insulin Syringes (Disposable) U-100 0.3 ML Misc     TAKE these medications   acetaminophen 325 MG tablet Commonly known as: Tylenol Take 2 tablets (650 mg total) by mouth every 4 (four) hours as needed for up to 14 days (for pain scale < 4).   amLODipine 5 MG tablet Commonly known as: NORVASC Take 1 tablet (5 mg total) by mouth daily. Start taking on: September 04, 2019   ibuprofen 600 MG tablet Commonly known as: ADVIL Take 1 tablet (600 mg total) by mouth every 6 (six) hours.   senna 8.6 MG Tabs tablet Commonly known as: SENOKOT Take 1 tablet (8.6 mg total) by mouth daily as needed for mild constipation.       Diet: carb modified diet  Activity: Advance as tolerated. Pelvic rest for 6 weeks.   Outpatient follow up:1 week BP/laceration follow-up, 6 weeks  post-partum Follow up Appt: No future appointments.  Follow up Visit, message sent 09/02/19:  Please schedule this patient for Postpartum visit in: 1 week with the following provider: Any provider For C/S patients schedule nurse incision check in weeks 2 weeks: no High risk pregnancy complicated by: GDM and GHTN Delivery mode: SVD Anticipated Birth Control: POPs PP Procedures needed: BP check  Schedule Integrated BH visit: no  Newborn Data: Live born female  Birth Weight:   APGAR: 32, 9  Newborn Delivery   Birth date/time: 09/02/2019 05:16:00 Delivery type: Vaginal, Spontaneous      Baby Feeding: Breast Disposition:home with mother   I confirm that I have verified the information documented in the resident's note and that I have also personally reperformed the history, physical exam and all medical decision making activities of this service and have verified that all service and findings are accurately documented in this student's note.   Clarisa Fling, NP 09/03/2019 11:51 AM

## 2019-09-02 NOTE — Progress Notes (Signed)
Patient admitted to Mother Baby Unit via [place] interpreter [phone]. Patient educated about safe sleep and fall precautions.  Instructed to call nurse when she needs to use the restroom.

## 2019-09-03 DIAGNOSIS — O24424 Gestational diabetes mellitus in childbirth, insulin controlled: Secondary | ICD-10-CM

## 2019-09-03 DIAGNOSIS — Z3A38 38 weeks gestation of pregnancy: Secondary | ICD-10-CM

## 2019-09-03 DIAGNOSIS — O403XX0 Polyhydramnios, third trimester, not applicable or unspecified: Secondary | ICD-10-CM

## 2019-09-03 DIAGNOSIS — O134 Gestational [pregnancy-induced] hypertension without significant proteinuria, complicating childbirth: Secondary | ICD-10-CM

## 2019-09-03 LAB — GLUCOSE, CAPILLARY: Glucose-Capillary: 106 mg/dL — ABNORMAL HIGH (ref 70–99)

## 2019-09-03 MED ORDER — IBUPROFEN 600 MG PO TABS: 600.0000 mg | ORAL_TABLET | Freq: Four times a day (QID) | ORAL | 0 refills | Status: DC

## 2019-09-03 MED ORDER — AMLODIPINE BESYLATE 5 MG PO TABS: 5.0000 mg | ORAL_TABLET | Freq: Every day | ORAL | 1 refills | Status: AC

## 2019-09-03 MED ORDER — SENNA 8.6 MG PO TABS: 1.0000 | ORAL_TABLET | Freq: Every day | ORAL | 0 refills | Status: AC | PRN

## 2019-09-03 MED ORDER — ACETAMINOPHEN 325 MG PO TABS: 650.0000 mg | ORAL_TABLET | ORAL | 0 refills | Status: DC | PRN

## 2019-09-03 MED ORDER — AMLODIPINE BESYLATE 5 MG PO TABS
5.0000 mg | ORAL_TABLET | Freq: Every day | ORAL | Status: DC
  Administered 2019-09-03 – 2019-09-04 (×2): 5 mg via ORAL
  Filled 2019-09-03 (×2): qty 1

## 2019-09-03 NOTE — Progress Notes (Signed)
Language line used interpreter [phone].  Informed MOB of feeding amount, answered MOB's questions about circumcision, went over pain medications, explained the 24 hrs screening for the baby.  MOB verbalized understanding no further questions at this time.

## 2019-09-03 NOTE — Lactation Note (Signed)
This note was copied from a baby's chart. Lactation Consultation Note  Patient Name: Brooke Reid ZSMOL'M Date: 09/03/2019 Reason for consult: Follow-up assessment;Early term 22-38.6wks  P2 mother whose infant is now 51 hours old.  This is an ETI at 38+5 weeks.  [place] interpreter [phone]) used for interpretation.  Mother had no questions/concerns related to breast feeding.  She informed me that she has a 41 year old that she breast fed.  Her feeding preference is breast/bottle.  Encouraged to continue feeding 8-12 times/24 hours or sooner if baby shows feeding cues.  Reminded mother to put baby to breast first prior to formula feeding to help increase milk supply.  Mother verbalized understanding.  Educated her to increase supplementation volumes.  Mother will call for latch assistance as needed.   Maternal Data    Feeding    LATCH Score                   Interventions    Lactation Tools Discussed/Used     Consult Status Consult Status: Follow-up Date: 09/04/19 Follow-up type: In-patient    Little Ishikawa 09/03/2019, 2:39 PM

## 2019-09-03 NOTE — Progress Notes (Signed)
[employer] interpreter used name Normand Sloop [phone].  Informed MOB about TcB level and STAT serum ordered with PKU.  MOB verbalized understanding.

## 2019-09-03 NOTE — Progress Notes (Signed)
Attending Circumcision Counseling Progress Note  Patient desires circumcision for her female infant.  Circumcision procedure details discussed, risks and benefits of procedure were also discussed.  These include but are not limited to: Benefits of circumcision in men include reduction in the rates of urinary tract infection (UTI), penile cancer, some sexually transmitted infections, penile inflammatory and retractile disorders, as well as easier hygiene.  Risks include bleeding , infection, injury of glans which may lead to penile deformity or urinary tract issues, unsatisfactory cosmetic appearance and other potential complications related to the procedure.  It was emphasized that this is an elective procedure.  Patient wants to proceed with circumcision; written informed consent obtained.  Will perform circumcision soon, routine circumcision and post circumcision care ordered for the infant.  Done via [place].  Feliz Beam, M.D. Attending [facility] [employer] (Faculty Practice)  09/03/2019, 6:25 PM   ADDENDUM  After inspection of the infant penis, it was felt too retracted into fat pad to proceed safely with circumcision at this point. This was discussed with the mother, recommend outpatient eval in 2 weeks or by pediatic urologist. Mother verbalizes understanding and is agreeable.  Done via [place]  Feliz Beam, M.D. Attending [facility] for [employer] Fish farm manager)

## 2019-09-03 NOTE — Progress Notes (Cosign Needed)
  Subjective: no complaints, up ad lib, voiding and tolerating PO, small lochia, plans to breastfeed, oral contraceptives (estrogen/progesterone), oral progesterone-only contraceptive  Objective: Blood pressure 116/76, pulse 71, temperature 98.1 F (36.7 C), temperature source Oral, resp. rate 18, height 5' 6.93" (1.7 m), weight 107.6 kg, last menstrual period 12/05/2018, SpO2 99 %, unknown if currently breastfeeding.  Physical Exam:  General: alert, cooperative and no distress Lochia:normal flow Chest: CTAB Heart: RRR no m/r/g Abdomen: +BS, soft, nontender,  Uterine Fundus: firm and below umbilicus DVT Evaluation: No evidence of DVT seen on physical exam. Extremities: no edema  Recent Labs    09/01/19 1922 09/02/19 0310  HGB 14.3 14.1  HCT 43.2 42.9    Assessment/Plan: Plan for discharge tomorrow. Possibly dc today as long as BP remains stable and infant is discharged, patient is agreeable to plan  A2GDM- fasting glucose impaired at 106 this morning, may need to go home with metformin, repeat A1C at post-partum visit  gHTN IP - BP improving since delivery and WNL today   LOS: 2 days   Brooke Reid 6/38/1771, 1:65 AM

## 2019-09-04 ENCOUNTER — Encounter: Payer: Self-pay | Admitting: Certified Nurse Midwife

## 2019-09-04 ENCOUNTER — Inpatient Hospital Stay (HOSPITAL_COMMUNITY): Admission: AD | Admit: 2019-09-04 | Payer: Self-pay | Source: Home / Self Care | Admitting: Obstetrics and Gynecology

## 2019-09-04 ENCOUNTER — Inpatient Hospital Stay (HOSPITAL_COMMUNITY): Payer: Self-pay

## 2019-09-04 LAB — GLUCOSE, CAPILLARY: Glucose-Capillary: 116 mg/dL — ABNORMAL HIGH (ref 70–99)

## 2019-09-04 MED ORDER — IBUPROFEN 600 MG PO TABS: 600.0000 mg | ORAL_TABLET | Freq: Four times a day (QID) | ORAL | 0 refills | Status: AC | PRN

## 2019-09-04 MED ORDER — ACETAMINOPHEN 325 MG PO TABS: 650.0000 mg | ORAL_TABLET | Freq: Four times a day (QID) | ORAL | 0 refills | Status: AC | PRN

## 2019-09-04 NOTE — Progress Notes (Signed)
[employer] interpreter used, interpreter name Ai [phone].  Went over breastfeeding and formula supplementation amount.  Patient reported that she knows to put baby on breast first and then give formula.  Gave MOB a hand pump. RN has not seen baby latch on breast. Encouraged MOB to call for assistance with breastfeeding and for latch score.  MOB verbalized she does not need anything and no further questions at this time.

## 2019-09-04 NOTE — Lactation Note (Signed)
This note was copied from a baby's chart. Lactation Consultation Note  Patient Name: Brooke Reid SFKCL'E Date: 09/04/2019 Reason for consult: Follow-up assessment  P2 mother whose infant is now 1 hours old.  This is an ETI at 38+5 weeks.  Her feeding preference is breast/bottle.  [place] interpreter [phone]) used for interpretation.  Mother had no questions/concerns this morning related to breast feeding.  Informed her that supplementation volumes can be increased if baby is not satisfied.  Mother is hoping to be discharged today; morning bilirubin level has been drawn and resulted.    Engorgement prevention/treatment reviewed.  Mother has a manual pump for home use.  I provided the [facility] phone number yesterday per mother's request.  However, mother may not be eligible to obtain a pump since her feeding preference is breast/bottle.  She will speak with the [facility] representative tomorrow.  Mother has our OP phone number for questions after discharge.   Maternal Data    Feeding Feeding Type: Bottle Fed - Formula Nipple Type: Slow - flow  LATCH Score                   Interventions    Lactation Tools Discussed/Used     Consult Status Consult Status: Complete Date: 09/04/19 Follow-up type: Call as needed    Dason Mosley R Lilyanne Mcquown 09/04/2019, 8:55 AM

## 2019-09-04 NOTE — Discharge Summary (Signed)
Postpartum Discharge Summary     Patient Name: Brooke Reid DOB: 07-07-1976 MRN: [id number]  Date of admission: 09/01/2019 Delivering Provider: Darlina Rumpf   Date of discharge: 09/04/2019  Admitting diagnosis: Labor Check  Intrauterine pregnancy: [redacted]w[redacted]d    Secondary diagnosis:  Principal Problem:   Encounter for induction of labor Active Problems:   AMA (advanced maternal age) multigravida 382+ unspecified trimester   Language barrier   GDM, class A2   Polyhydramnios in third trimester   Indication for care in labor or delivery   Gestational hypertension  Additional problems:      Discharge diagnosis: Term Pregnancy Delivered                                                                                                Post partum procedures:NA  Augmentation: AROM and Pitocin  Complications: None  Hospital course:  Induction of Labor With Vaginal Delivery   43y.o. yo G2P1001 at 374w5das admitted to the hospital 09/01/2019 for induction of labor.  Indication for induction: Gestational hypertension and A2 DM.  Patient had an uncomplicated labor course as follows: Membrane Rupture Time/Date: 4:07 AM ,09/02/2019   Intrapartum Procedures: Episiotomy: None [1]                                         Lacerations:  2nd degree [3];Perineal [11]  Patient had delivery of a Viable infant.  Information for the patient's newborn:  Brooke Reid, [name]    09/02/2019  Details of delivery can be found in separate delivery note.  Patient had a routine postpartum course. Patient is discharged home 09/04/19. Delivery time: 5:16 AM    Magnesium Sulfate received: No BMZ received: No Rhophylac:No MMR:No Transfusion:No  Physical exam  Vitals:   09/03/19 1245 09/03/19 2143 09/04/19 0552 09/04/19 1003  BP: 116/80 128/85 112/76 124/80  Pulse: 79 75 71 86  Resp: '19 18 18   ' Temp: 99.3 F (37.4 C) 98 F (36.7 C) 98.2 F (36.8 C)   TempSrc: Oral     SpO2:   99%   Weight:       Height:       General: alert, cooperative and no distress Lochia: appropriate Uterine Fundus: firm Incision: N/A DVT Evaluation: No evidence of DVT seen on physical exam. No significant calf/ankle edema. Labs: Lab Results  Component Value Date   WBC 11.2 (H) 09/02/2019   HGB 14.1 09/02/2019   HCT 42.9 09/02/2019   MCV 86.0 09/02/2019   PLT 227 09/02/2019   CMP Latest Ref Rng & Units 09/02/2019  Glucose 70 - 99 mg/dL 104(H)  BUN 6 - 20 mg/dL 9  Creatinine 0.44 - 1.00 mg/dL 0.38(L)  Sodium 135 - 145 mmol/L 135  Potassium 3.5 - 5.1 mmol/L 3.5  Chloride 98 - 111 mmol/L 107  CO2 22 - 32 mmol/L 21(L)  Calcium 8.9 - 10.3 mg/dL 8.3(L)  Total Protein 6.5 - 8.1 g/dL 6.2(L)  Total Bilirubin 0.3 -  1.2 mg/dL 0.5  Alkaline Phos 38 - 126 U/L 178(H)  AST 15 - 41 U/L 16  ALT 0 - 44 U/L 15    Discharge instruction: per After Visit Summary and "Baby and Me Booklet".  After visit meds:  Allergies as of 09/04/2019   No Known Allergies     Medication List    STOP taking these medications   insulin NPH Human 100 UNIT/ML injection Commonly known as: NOVOLIN N   Insulin Syringes (Disposable) U-100 0.3 ML Misc     TAKE these medications   acetaminophen 325 MG tablet Commonly known as: Tylenol Take 2 tablets (650 mg total) by mouth every 6 (six) hours as needed for up to 14 days.   amLODipine 5 MG tablet Commonly known as: NORVASC Take 1 tablet (5 mg total) by mouth daily.   ibuprofen 600 MG tablet Commonly known as: ADVIL Take 1 tablet (600 mg total) by mouth every 6 (six) hours as needed.   senna 8.6 MG Tabs tablet Commonly known as: SENOKOT Take 1 tablet (8.6 mg total) by mouth daily as needed for mild constipation.       Diet: carb modified diet  Activity: Advance as tolerated. Pelvic rest for 6 weeks.   Outpatient follow up:1 week BP/laceration follow-up, 6 weeks post-partum Follow up Appt: No future appointments.  Follow up Visit, message sent  09/02/19:  Please schedule this patient for Postpartum visit in: 1 week with the following provider: Any provider For C/S patients schedule nurse incision check in weeks 2 weeks: no High risk pregnancy complicated by: GDM, GHTN, polyhydramnios, AMA, obesity, 2nd degree laceration Delivery mode: SVD Anticipated Birth Control: POPs PP Procedures needed: BP check  Schedule Integrated BH visit: no  -message sent to [facility] 09/04/2019 as no appointments had yet been scheduled for this patient upon discharge  Newborn Data: Live born female  Birth Weight:   APGAR: 84, 9  Newborn Delivery   Birth date/time: 09/02/2019 05:16:00 Delivery type: Vaginal, Spontaneous      Baby Feeding: Breast Disposition:home with mother   I confirm that I have verified the information documented in the resident's note and that I have also personally reperformed the history, physical exam and all medical decision making activities of this service and have verified that all service and findings are accurately documented in this student's note.   Clarisa Fling, NP 09/04/2019 11:43 AM

## 2019-09-04 NOTE — Progress Notes (Signed)
Used interpreter "ellie" [phone] to discuss discharge instructions. All questions answered. Mom will hold baby out to car as Father is in car with their 41 yo.

## 2019-09-06 ENCOUNTER — Encounter: Payer: Self-pay | Admitting: *Deleted

## 2019-09-09 ENCOUNTER — Other Ambulatory Visit: Payer: Self-pay

## 2019-09-09 ENCOUNTER — Ambulatory Visit (INDEPENDENT_AMBULATORY_CARE_PROVIDER_SITE_OTHER): Payer: Self-pay | Admitting: Lactation Services

## 2019-09-09 DIAGNOSIS — Z0130 Encounter for examination of blood pressure without abnormal findings: Secondary | ICD-10-CM

## 2019-09-09 NOTE — Progress Notes (Signed)
Pt here for BP check postpartum.   [employer] Interpreter [place] [phone] Earlie Server.   Pt reports she is feeling great! Pt denies headaches, blurred vision or dizziness.   Pt concerned about her blood sugars and want to know if it will go back to normal. Discussed that usually yes, although she should be checked again at 6 weeks with a follow up test in 6 weeks.   Pt wants to know if her vaginal stitches will dissolve on their own, discussed they will dissolve on their own.   Pt is having some pain at the incision. Discussed she can take Tylenol or Motrin as needed.   Pt to follow up with Provider and for 2 hour GTT repeated. Pt voiced understanding. Reviewed coming in fasting.

## 2019-10-06 ENCOUNTER — Other Ambulatory Visit: Payer: Self-pay | Admitting: Emergency Medicine

## 2019-10-06 DIAGNOSIS — O24419 Gestational diabetes mellitus in pregnancy, unspecified control: Secondary | ICD-10-CM

## 2019-10-13 ENCOUNTER — Other Ambulatory Visit: Payer: Self-pay

## 2019-10-13 ENCOUNTER — Ambulatory Visit (INDEPENDENT_AMBULATORY_CARE_PROVIDER_SITE_OTHER): Payer: Self-pay | Admitting: Obstetrics and Gynecology

## 2019-10-13 ENCOUNTER — Encounter: Payer: Self-pay | Admitting: Obstetrics and Gynecology

## 2019-10-13 DIAGNOSIS — O24419 Gestational diabetes mellitus in pregnancy, unspecified control: Secondary | ICD-10-CM

## 2019-10-13 DIAGNOSIS — Z3009 Encounter for other general counseling and advice on contraception: Secondary | ICD-10-CM

## 2019-10-13 DIAGNOSIS — Z1389 Encounter for screening for other disorder: Secondary | ICD-10-CM

## 2019-10-13 MED ORDER — NORETHINDRONE 0.35 MG PO TABS: 1.0000 | ORAL_TABLET | Freq: Every day | ORAL | 11 refills | Status: AC

## 2019-10-13 NOTE — Progress Notes (Signed)
Subjective:     Brooke Reid is a 43 y.o. female who presents for a postpartum visit. She is 5 weeks postpartum following a spontaneous vaginal delivery. I have fully reviewed the prenatal and intrapartum course. The delivery was at 38.5 gestational weeks. Outcome: spontaneous vaginal delivery. Anesthesia: none. Postpartum course has been uncomplicated. Baby's course has been uncomplicated. Baby is feeding by both breast and bottle - gerber good start. Bleeding staining only. Bowel function is normal. Bladder function is normal. Patient is not sexually active. Contraception method is OCP (estrogen/progesterone). Postpartum depression screening: Negative The following portions of the patient's history were reviewed and updated as appropriate: allergies, current medications, past family history, past medical history, past social history, past surgical history and problem list.  Review of Systems Pertinent items are noted in HPI.   Objective:    BP 121/87   Pulse 82   Wt 210 lb (95.3 kg)   BMI 32.96 kg/m   General:  alert and cooperative  Lungs: clear to auscultation bilaterally  Heart:  regular rate and rhythm, S1, S2 normal, no murmur, click, rub or gallop  Abdomen: soft, non-tender; bowel sounds normal; no masses,  no organomegaly   Assessment:   Normal postpartum exam. Pap smear not done at today's visit.  Last pap 2020 which was negative.   Plan:   1. Contraception: oral progesterone-only contraceptive 2.  2 hour GTT today. 3. Follow up as needed.  Lezlie Lye, NP 10/13/19 4:35 PM

## 2019-10-13 NOTE — Progress Notes (Signed)
Subjective:     Brooke Reid is a 43 y.o. female who presents for a postpartum visit. She is 5 weeks postpartum following a spontaneous vaginal delivery. I have fully reviewed the prenatal and intrapartum course. The delivery was at 38.5 gestational weeks. Outcome: spontaneous vaginal delivery. Anesthesia: none. Postpartum course has been uncomplicated. Baby's course has been uncomplicated. Baby is feeding by both breast and bottle - gerber good start. Bleeding staining only. Bowel function is normal. Bladder function is normal. Patient is not sexually active. Contraception method is OCP (estrogen/progesterone). Postpartum depression screening: Negative The following portions of the patient's history were reviewed and updated as appropriate: allergies, current medications, past family history, past medical history, past social history, past surgical history and problem list.  Review of Systems Pertinent items are noted in HPI.   Objective:    BP 121/87   Pulse 82   Wt 210 lb (95.3 kg)   BMI 32.96 kg/m   General:  alert and cooperative  Lungs: clear to auscultation bilaterally  Heart:  regular rate and rhythm, S1, S2 normal, no murmur, click, rub or gallop  Abdomen: soft, non-tender; bowel sounds normal; no masses,  no organomegaly   Assessment:   Normal postpartum exam. Pap smear not done at today's visit.  Last pap 2020 which was negative.   Plan:   1. Contraception: oral progesterone-only contraceptive 2.  2 hour GTT today. 3. Follow up as needed.  Noni Saupe I, NP 10/13/19 1:30 PM

## 2019-10-13 NOTE — Patient Instructions (Signed)

## 2019-10-14 LAB — GLUCOSE TOLERANCE, 2 HOURS
Glucose, 2 hour: 356 mg/dL — ABNORMAL HIGH (ref 65–139)
Glucose, GTT - Fasting: 200 mg/dL — ABNORMAL HIGH (ref 65–99)

## 2020-04-16 ENCOUNTER — Ambulatory Visit: Payer: Self-pay | Attending: Internal Medicine

## 2020-04-16 DIAGNOSIS — Z23 Encounter for immunization: Secondary | ICD-10-CM

## 2020-04-16 NOTE — Progress Notes (Signed)
   Covid-19 Vaccination Clinic  Name:  Brooke Reid    MRN: [id number] DOB: [birth year]  04/16/2020  [name] was observed post Covid-19 immunization for 15 minutes without incident. She was provided with Vaccine Information Sheet and instruction to access the V-Safe system.   [name] was instructed to call 911 with any severe reactions post vaccine: Marland Kitchen Difficulty breathing  . Swelling of face and throat  . A fast heartbeat  . A bad rash all over body  . Dizziness and weakness   Immunizations Administered    Name Date Dose VIS Date Route   Pfizer COVID-19 Vaccine 04/16/2020 12:39 PM 0.3 mL 02/15/2019 Intramuscular   Manufacturer: [employer], [employer]   Lot: [id number]   NDC: [address]-7

## 2020-08-27 IMAGING — US US FETAL BPP W/NONSTRESS
1 series · 13 of 13 positions shown · non-contrast
Comparison: none

[Series 1: us fetal bpp w/nonstress · 13 acquisitions, 13 frames shown]
[im 1/13]
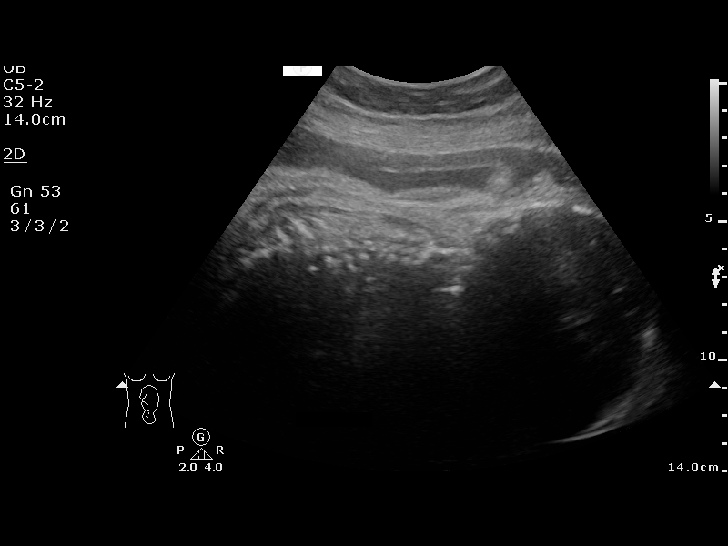
[im 2/13]
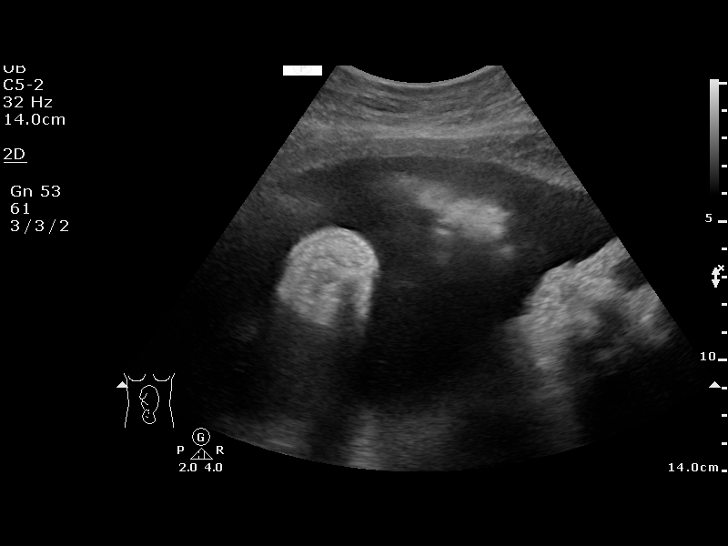
[im 3/13]
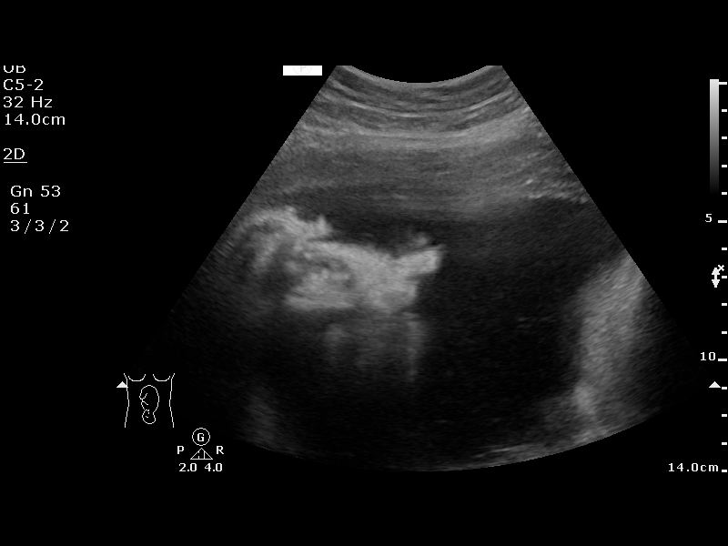
[im 4/13]
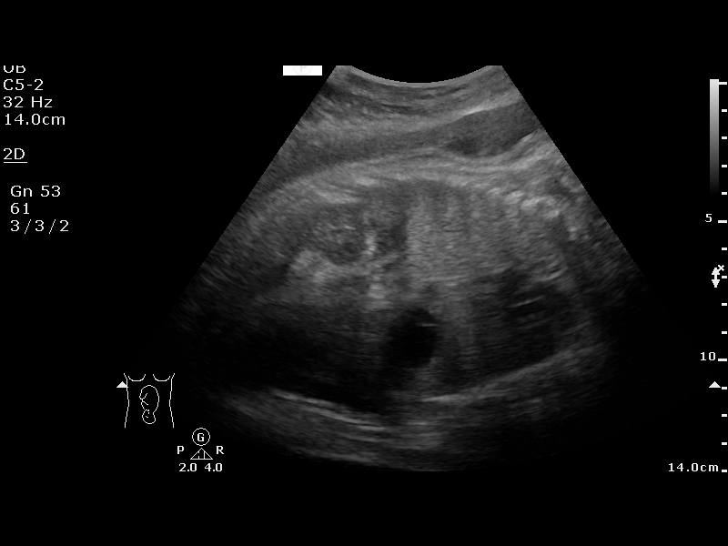
[im 5/13]
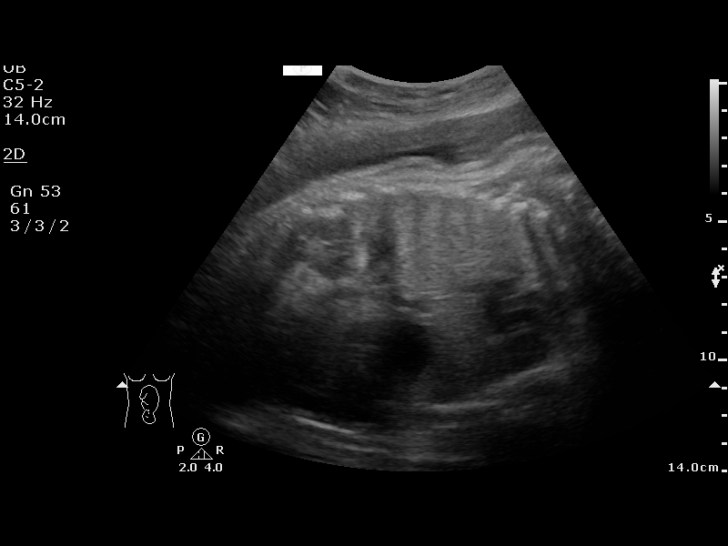
[im 6/13]
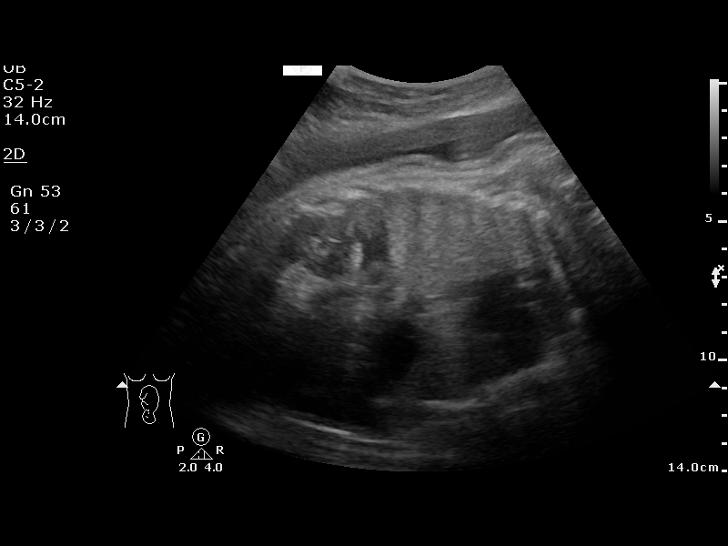
[im 7/13]
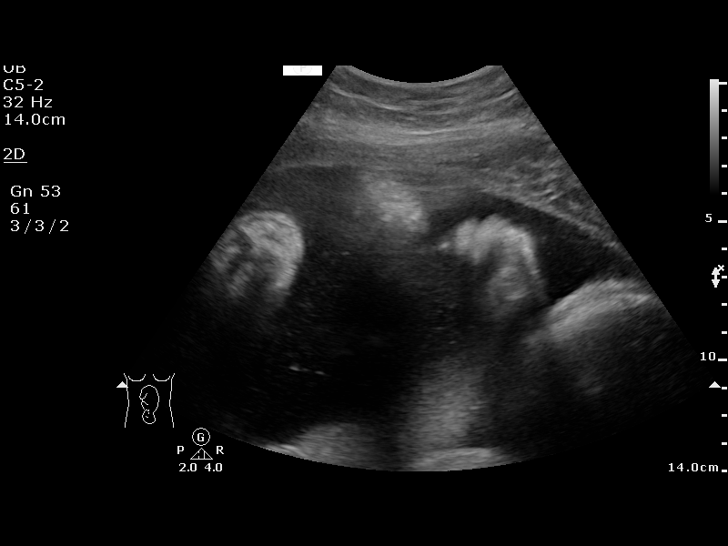
[im 8/13]
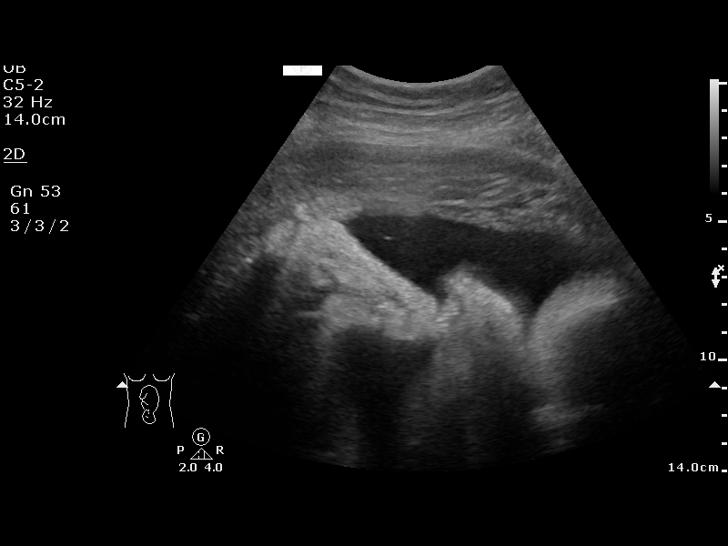
[im 9/13]
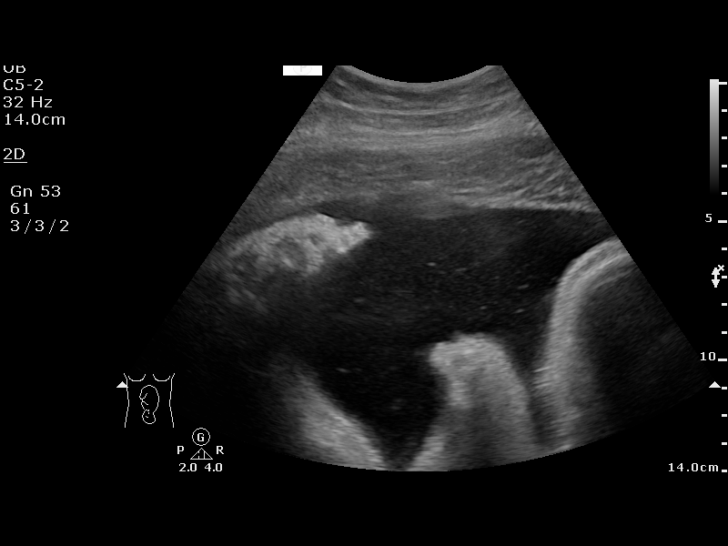
[im 10/13]
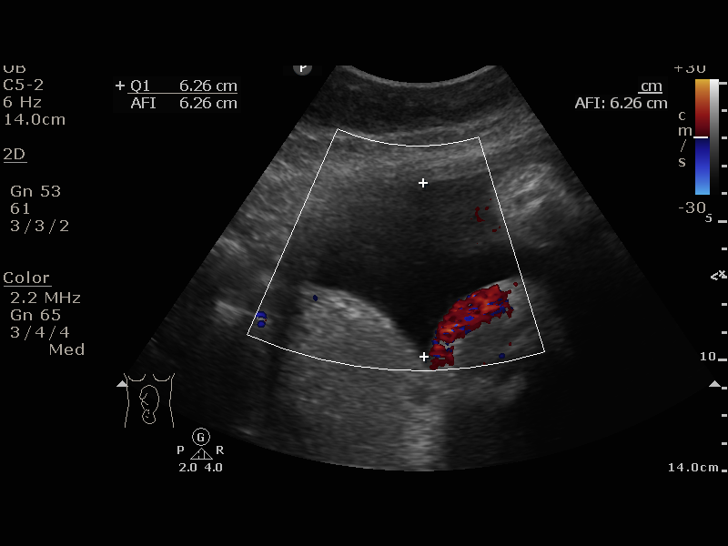
[im 11/13]
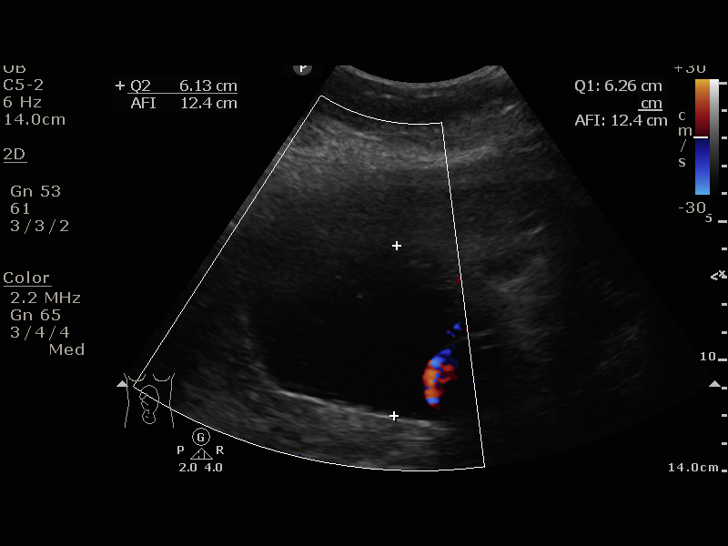
[im 12/13]
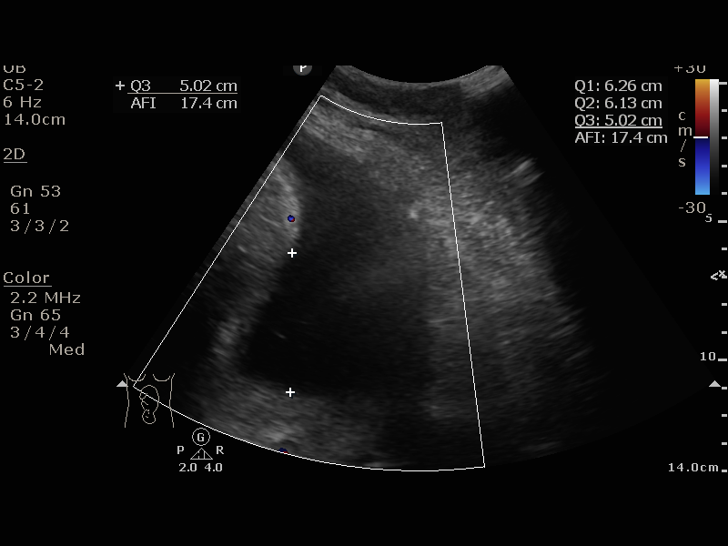
[im 13/13]
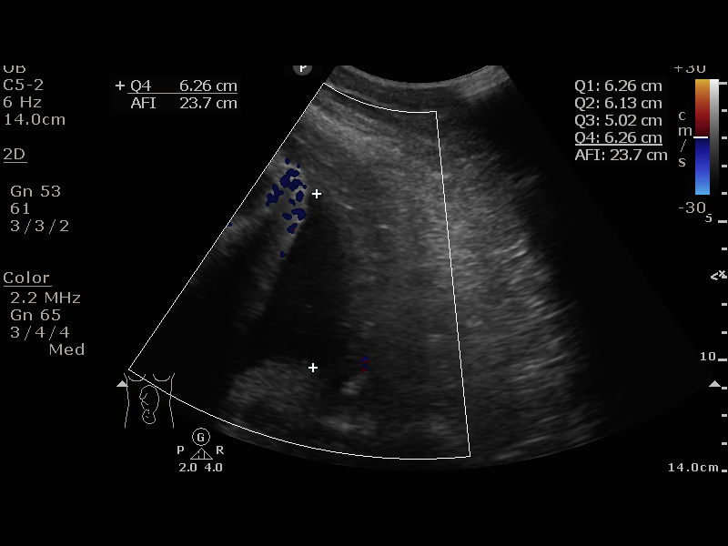

[13 of 13 positions shown; findings below may reference images not displayed]

[address]
                                                            Women's
                                                            [REDACTED]

  1  US FETAL BPP W/NONSTRESS             76818.4      [facility]
 ----------------------------------------------------------------------

 ----------------------------------------------------------------------
Indications

  34 weeks gestation of pregnancy
  Gestational diabetes in pregnancy, insulin
  controlled
  Advanced maternal age multigravida 35+,
  third trimester
 ----------------------------------------------------------------------
Fetal Evaluation

 Num Of Fetuses:         1
 Preg. Location:         Intrauterine
 Cardiac Activity:       Observed
 Fetal Lie:              Maternal left side
 Presentation:           Cephalic

 Amniotic Fluid
 AFI FV:      Within normal limits

 AFI Sum(cm)                 Largest Pocket(cm)


 RUQ(cm)       RLQ(cm)       LUQ(cm)        LLQ(cm)

Biophysical Evaluation

 Amniotic F.V:   Pocket => 2 cm two         F. Tone:        Observed
                 planes
 F. Movement:    Observed                   N.S.T:          Nonreactive
 F. Breathing:   Observed                   Score:          [DATE]
Impression

 BPP [DATE]
Recommendations

 -Continue weekly BPP till delivery.
                  Krafft, Eva Birgitte

## 2020-09-10 IMAGING — US US FETAL BPP W/NONSTRESS
1 series · 13 of 14 positions shown · non-contrast
Comparison: none

[Series 1: us fetal bpp w/nonstress · 14 acquisitions, 13 frames shown]
[im 1/14]
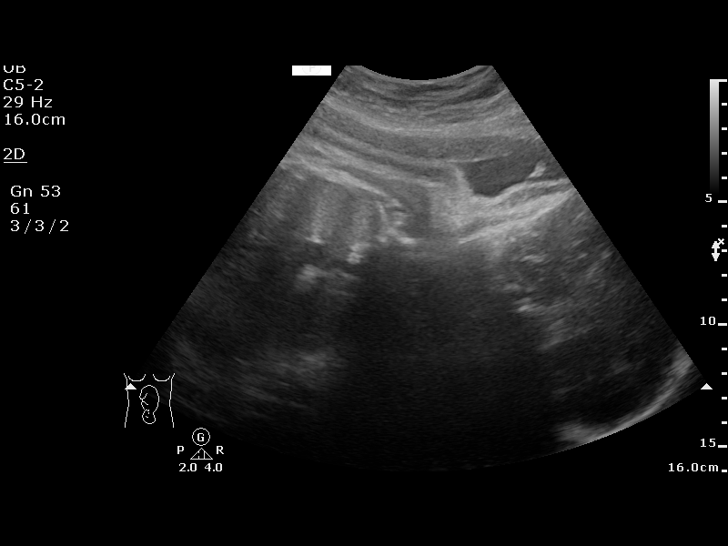
[im 2/14]
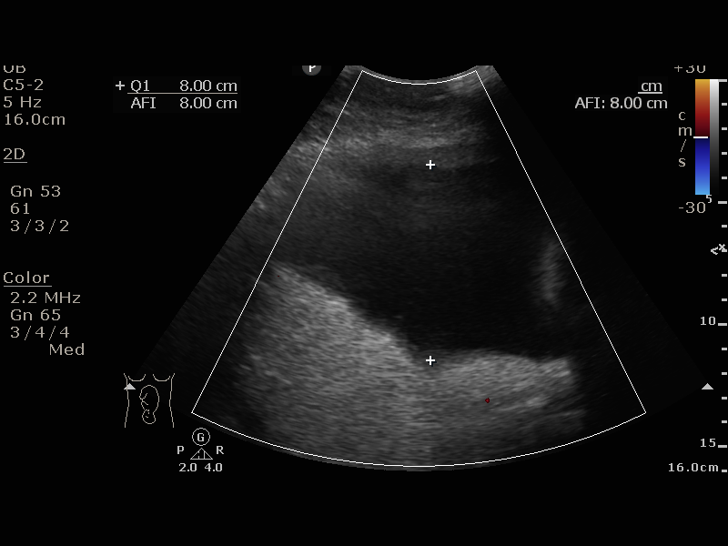
[im 3/14]
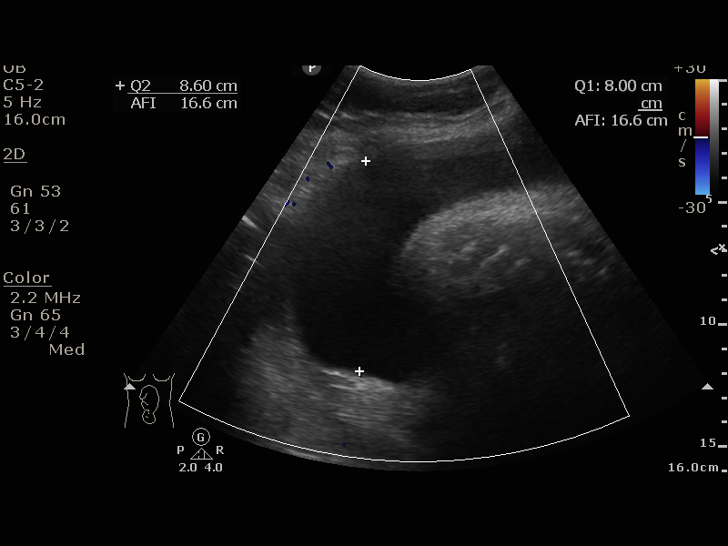
[im 4/14]
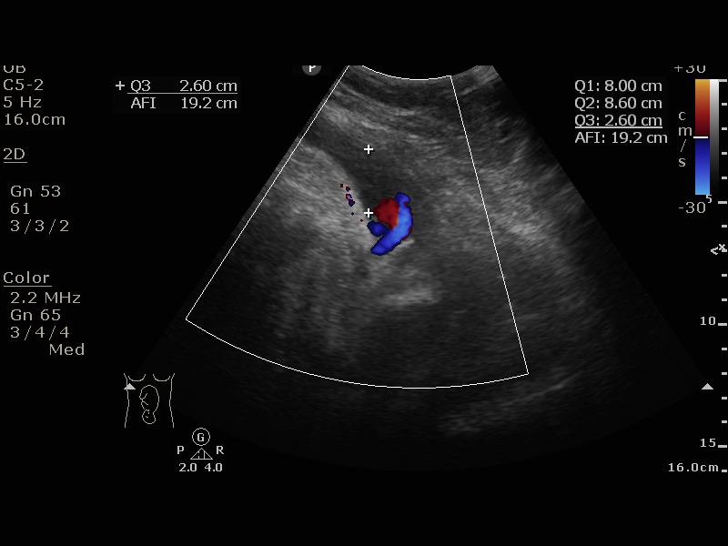
[im 5/14]
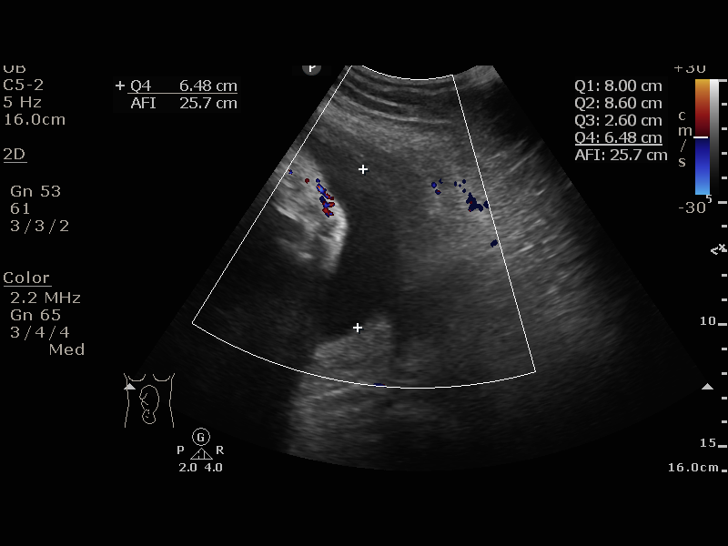
[im 6/14]
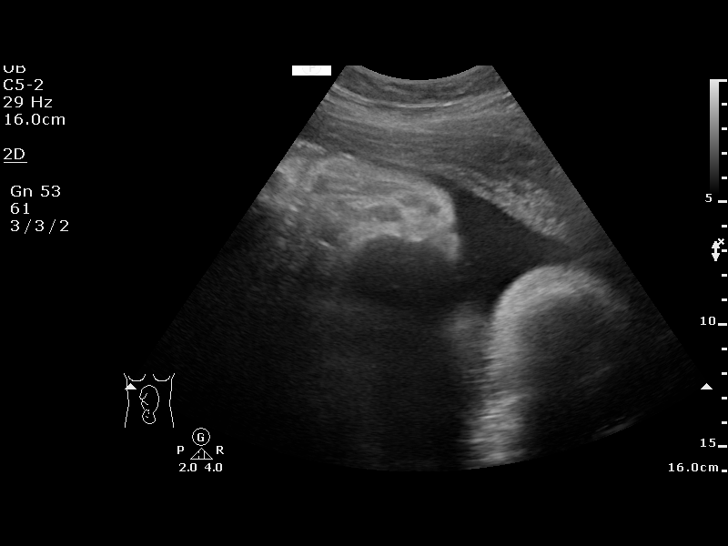
[im 8/14]
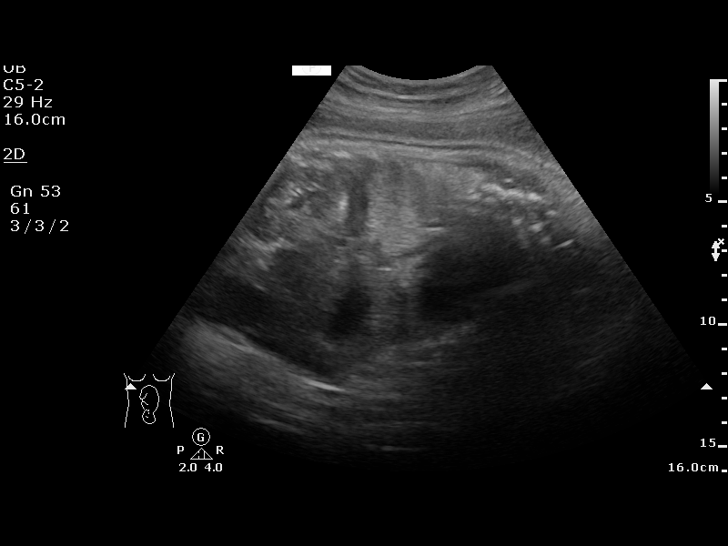
[im 9/14]
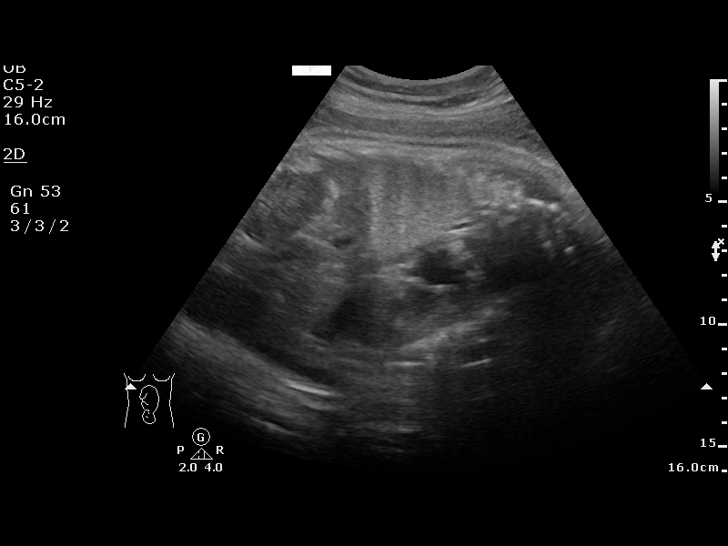
[im 10/14]
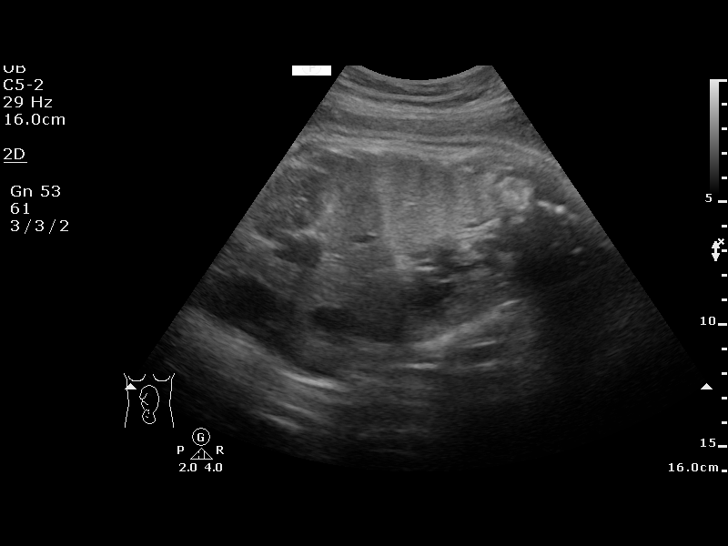
[im 11/14]
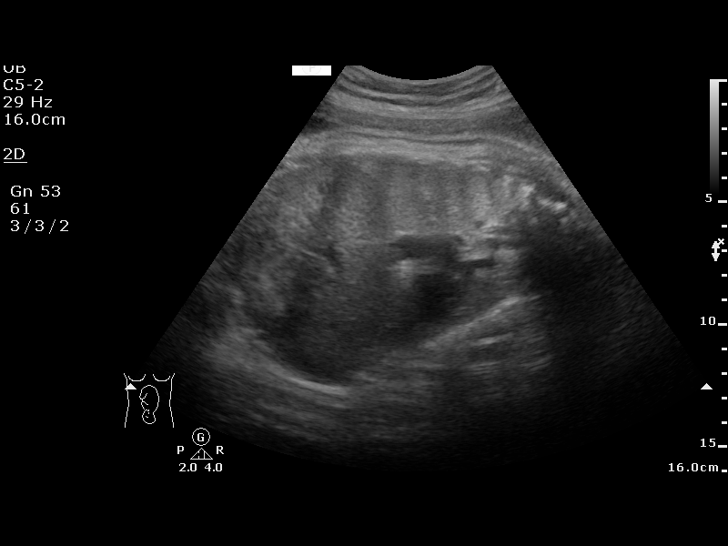
[im 12/14]
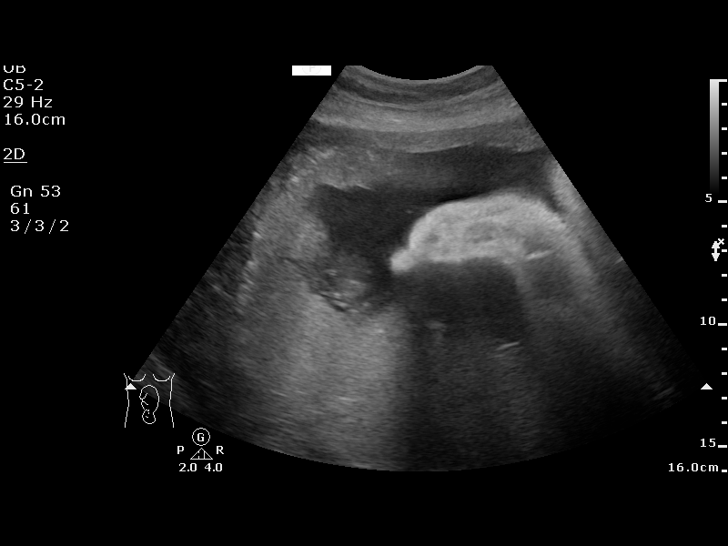
[im 13/14]
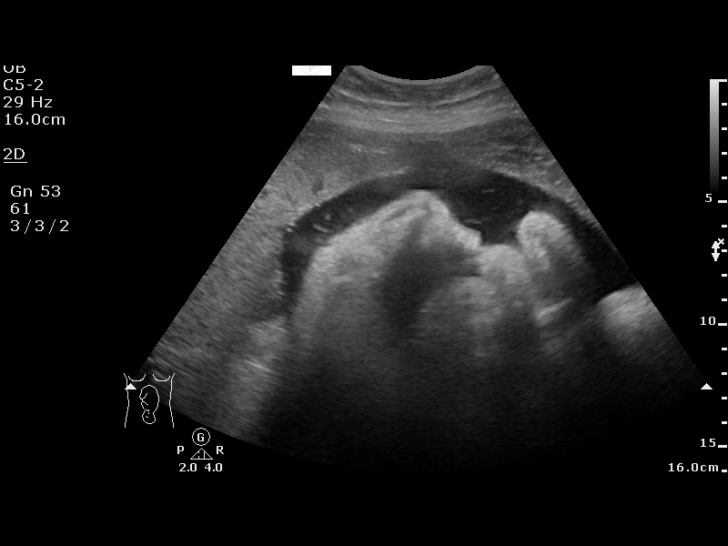
[im 14/14]
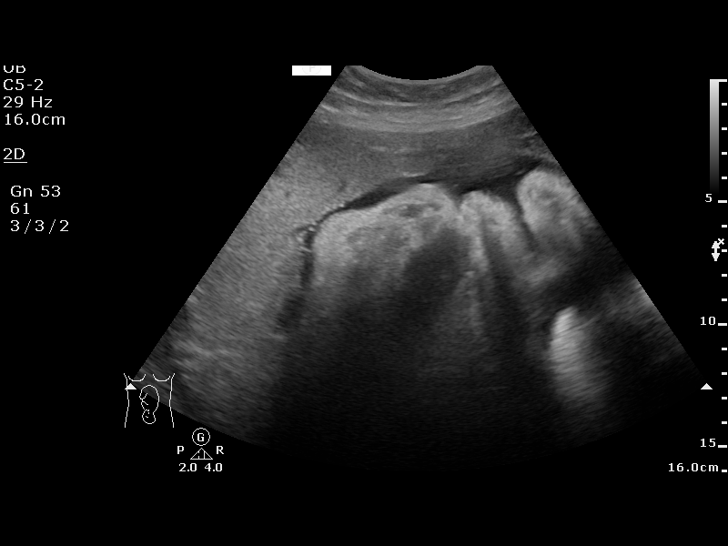

[13 of 14 positions shown; findings below may reference images not displayed]

[address]
                                                            Women's
                                                            [REDACTED]

  1  US FETAL BPP W/NONSTRESS             76818.4      [facility]
 ----------------------------------------------------------------------

 ----------------------------------------------------------------------
Service(s) Provided

 ----------------------------------------------------------------------
Indications

  Weeks of gestation of pregnancy not
  specified
  Gestational diabetes in pregnancy, insulin
  controlled
  Advanced maternal age multigravida 35+,
  third trimester
 ----------------------------------------------------------------------
Fetal Evaluation

 Num Of Fetuses:         1
 Preg. Location:         Intrauterine
 Cardiac Activity:       Observed
 Presentation:           Cephalic

 Amniotic Fluid
 AFI FV:      Polyhydramnios

 AFI Sum(cm)                 Largest Pocket(cm)


 RUQ(cm)       RLQ(cm)       LUQ(cm)        LLQ(cm)
 8
Biophysical Evaluation

 Amniotic F.V:   Pocket => 2 cm two         F. Tone:        Observed
                 planes
 F. Movement:    Observed                   N.S.T:          Reactive
 F. Breathing:   Observed                   Score:          [DATE]
Impression

 BPP [DATE]
 mild poly
Recommendations

 -Continue weekly BPP till delivery.
                  Guerra, Joao
